# Patient Record
Sex: Female | Born: 1980 | Race: Black or African American | Hispanic: No | Marital: Married | Smoking: Never smoker
Health system: Southern US, Community
[De-identification: ages and names within clinical notes are randomized; demographics above are authoritative.]

## PROBLEM LIST (undated history)

## (undated) ENCOUNTER — Inpatient Hospital Stay (HOSPITAL_COMMUNITY): Payer: Self-pay

## (undated) DIAGNOSIS — J069 Acute upper respiratory infection, unspecified: Secondary | ICD-10-CM

## (undated) DIAGNOSIS — L309 Dermatitis, unspecified: Secondary | ICD-10-CM

## (undated) DIAGNOSIS — O26899 Other specified pregnancy related conditions, unspecified trimester: Secondary | ICD-10-CM

## (undated) DIAGNOSIS — I10 Essential (primary) hypertension: Secondary | ICD-10-CM

## (undated) DIAGNOSIS — Z8619 Personal history of other infectious and parasitic diseases: Secondary | ICD-10-CM

## (undated) DIAGNOSIS — O24419 Gestational diabetes mellitus in pregnancy, unspecified control: Secondary | ICD-10-CM

## (undated) DIAGNOSIS — R12 Heartburn: Secondary | ICD-10-CM

## (undated) DIAGNOSIS — B999 Unspecified infectious disease: Secondary | ICD-10-CM

## (undated) DIAGNOSIS — D219 Benign neoplasm of connective and other soft tissue, unspecified: Secondary | ICD-10-CM

## (undated) DIAGNOSIS — IMO0002 Reserved for concepts with insufficient information to code with codable children: Secondary | ICD-10-CM

## (undated) DIAGNOSIS — R51 Headache: Secondary | ICD-10-CM

## (undated) DIAGNOSIS — R011 Cardiac murmur, unspecified: Secondary | ICD-10-CM

## (undated) HISTORY — DX: Cardiac murmur, unspecified: R01.1

## (undated) HISTORY — DX: Dermatitis, unspecified: L30.9

## (undated) HISTORY — DX: Essential (primary) hypertension: I10

## (undated) HISTORY — DX: Reserved for concepts with insufficient information to code with codable children: IMO0002

## (undated) HISTORY — PX: NO PAST SURGERIES: SHX2092

## (undated) HISTORY — DX: Unspecified infectious disease: B99.9

## (undated) HISTORY — DX: Personal history of other infectious and parasitic diseases: Z86.19

## (undated) HISTORY — DX: Acute upper respiratory infection, unspecified: J06.9

## (undated) HISTORY — DX: Benign neoplasm of connective and other soft tissue, unspecified: D21.9

---

## 2007-02-08 DIAGNOSIS — R87619 Unspecified abnormal cytological findings in specimens from cervix uteri: Secondary | ICD-10-CM

## 2007-02-08 DIAGNOSIS — IMO0002 Reserved for concepts with insufficient information to code with codable children: Secondary | ICD-10-CM

## 2007-02-08 HISTORY — DX: Reserved for concepts with insufficient information to code with codable children: IMO0002

## 2007-02-08 HISTORY — DX: Unspecified abnormal cytological findings in specimens from cervix uteri: R87.619

## 2008-02-08 DIAGNOSIS — D219 Benign neoplasm of connective and other soft tissue, unspecified: Secondary | ICD-10-CM

## 2008-02-08 HISTORY — DX: Benign neoplasm of connective and other soft tissue, unspecified: D21.9

## 2009-02-07 HISTORY — PX: WISDOM TOOTH EXTRACTION: SHX21

## 2010-05-31 ENCOUNTER — Ambulatory Visit: Payer: PRIVATE HEALTH INSURANCE

## 2010-12-27 ENCOUNTER — Encounter: Payer: Self-pay | Admitting: Family Medicine

## 2011-05-16 ENCOUNTER — Encounter (INDEPENDENT_AMBULATORY_CARE_PROVIDER_SITE_OTHER): Payer: BC Managed Care – HMO | Admitting: Obstetrics and Gynecology

## 2011-05-16 DIAGNOSIS — Z01419 Encounter for gynecological examination (general) (routine) without abnormal findings: Secondary | ICD-10-CM

## 2011-06-06 ENCOUNTER — Encounter: Payer: BC Managed Care – HMO | Admitting: Obstetrics and Gynecology

## 2011-06-06 ENCOUNTER — Other Ambulatory Visit: Payer: BC Managed Care – HMO

## 2011-06-14 ENCOUNTER — Ambulatory Visit (INDEPENDENT_AMBULATORY_CARE_PROVIDER_SITE_OTHER): Payer: BC Managed Care – HMO | Admitting: Obstetrics and Gynecology

## 2011-06-14 DIAGNOSIS — Z331 Pregnant state, incidental: Secondary | ICD-10-CM

## 2011-06-14 LAB — POCT URINALYSIS DIPSTICK
Blood, UA: NEGATIVE
Glucose, UA: NEGATIVE
Ketones, UA: NEGATIVE
Spec Grav, UA: 1.015

## 2011-06-14 NOTE — Progress Notes (Signed)
Hx reviewed by VL. Viability U/S ordered.  Per Misty Stanley, pt declined appt until 07/01/11 to acomodate work schedule.

## 2011-06-15 LAB — PRENATAL PANEL VII
Antibody Screen: NEGATIVE
Basophils Absolute: 0 10*3/uL (ref 0.0–0.1)
Eosinophils Absolute: 0.1 10*3/uL (ref 0.0–0.7)
Eosinophils Relative: 1 % (ref 0–5)
Hepatitis B Surface Ag: NEGATIVE
Lymphocytes Relative: 35 % (ref 12–46)
Lymphs Abs: 2.5 10*3/uL (ref 0.7–4.0)
MCV: 81.9 fL (ref 78.0–100.0)
Neutrophils Relative %: 53 % (ref 43–77)
Platelets: 339 10*3/uL (ref 150–400)
RBC: 4.74 MIL/uL (ref 3.87–5.11)
RDW: 14.8 % (ref 11.5–15.5)
Rubella: 90.2 IU/mL — ABNORMAL HIGH
WBC: 7 10*3/uL (ref 4.0–10.5)

## 2011-06-16 LAB — CULTURE, OB URINE: Colony Count: 40000

## 2011-06-16 LAB — HEMOGLOBINOPATHY EVALUATION: Hgb A: 97.4 % (ref 96.8–97.8)

## 2011-07-01 ENCOUNTER — Encounter: Payer: Self-pay | Admitting: Obstetrics and Gynecology

## 2011-07-01 ENCOUNTER — Ambulatory Visit (INDEPENDENT_AMBULATORY_CARE_PROVIDER_SITE_OTHER): Payer: BC Managed Care – HMO | Admitting: Obstetrics and Gynecology

## 2011-07-01 ENCOUNTER — Ambulatory Visit (INDEPENDENT_AMBULATORY_CARE_PROVIDER_SITE_OTHER): Payer: BC Managed Care – HMO

## 2011-07-01 ENCOUNTER — Other Ambulatory Visit: Payer: Self-pay | Admitting: Obstetrics and Gynecology

## 2011-07-01 VITALS — BP 102/62 | Wt 225.0 lb

## 2011-07-01 DIAGNOSIS — O3680X0 Pregnancy with inconclusive fetal viability, not applicable or unspecified: Secondary | ICD-10-CM

## 2011-07-01 DIAGNOSIS — D259 Leiomyoma of uterus, unspecified: Secondary | ICD-10-CM

## 2011-07-01 DIAGNOSIS — Z331 Pregnant state, incidental: Secondary | ICD-10-CM

## 2011-07-01 DIAGNOSIS — O3410 Maternal care for benign tumor of corpus uteri, unspecified trimester: Secondary | ICD-10-CM

## 2011-07-01 LAB — US OB TRANSVAGINAL

## 2011-07-01 LAB — US OB COMP LESS 14 WKS

## 2011-07-01 NOTE — Progress Notes (Signed)
Pt. Stated some pain from fibroid. No other issues today .  Ultrasound: 9+4 weeks = 10 weeks    EDD:01/27/12  FHR:119  Large fibroid measuring 15.4 cm displacing uterus to the right. Impossible to clarify origin yet but could be LUS.  Reviewed findings with patient and husband, increased risk of cesarean section, pain, PTL NOB on 07/06/11

## 2011-07-02 ENCOUNTER — Encounter: Payer: Self-pay | Admitting: Obstetrics and Gynecology

## 2011-07-02 DIAGNOSIS — D259 Leiomyoma of uterus, unspecified: Secondary | ICD-10-CM | POA: Insufficient documentation

## 2011-07-02 DIAGNOSIS — O3410 Maternal care for benign tumor of corpus uteri, unspecified trimester: Secondary | ICD-10-CM | POA: Insufficient documentation

## 2011-07-06 ENCOUNTER — Ambulatory Visit (INDEPENDENT_AMBULATORY_CARE_PROVIDER_SITE_OTHER): Payer: BC Managed Care – HMO | Admitting: Obstetrics and Gynecology

## 2011-07-06 ENCOUNTER — Encounter: Payer: Self-pay | Admitting: Obstetrics and Gynecology

## 2011-07-06 ENCOUNTER — Ambulatory Visit (INDEPENDENT_AMBULATORY_CARE_PROVIDER_SITE_OTHER): Payer: BC Managed Care – HMO

## 2011-07-06 VITALS — BP 122/64 | Wt 229.0 lb

## 2011-07-06 DIAGNOSIS — O3410 Maternal care for benign tumor of corpus uteri, unspecified trimester: Secondary | ICD-10-CM

## 2011-07-06 DIAGNOSIS — Z3490 Encounter for supervision of normal pregnancy, unspecified, unspecified trimester: Secondary | ICD-10-CM

## 2011-07-06 DIAGNOSIS — D259 Leiomyoma of uterus, unspecified: Secondary | ICD-10-CM

## 2011-07-06 LAB — US OB COMP LESS 14 WKS

## 2011-07-06 LAB — POCT WET PREP (WET MOUNT)

## 2011-07-06 MED ORDER — METRONIDAZOLE 500 MG PO TABS: 500.0000 mg | ORAL_TABLET | Freq: Two times a day (BID) | ORAL | Status: AC

## 2011-07-06 NOTE — Progress Notes (Signed)
No complaints  Filed Vitals:   07/06/11 1509  BP: 122/64   Unable to hear FHTs  ROS: noncontributory  Physical Examination: General appearance - alert, well appearing, and in no distress Neck - supple, no significant adenopathy Chest - clear to auscultation, no wheezes, rales or rhonchi, symmetric air entry Heart - normal rate and regular rhythm Abdomen - soft, nontender, nondistended, no masses or organomegaly Breasts - breasts appear normal, no suspicious masses, no skin or nipple changes or axillary nodes Pelvic - normal external genitalia, vulva, vagina, cervix, uterus and adnexa Back exam - no CVAT Extremities - no edema, redness or tenderness in the calves or thighs  Results for orders placed in visit on 07/06/11  US OB COMP LESS 14 WKS      Component Value Range   See scanned in report             POCT WET PREP (WET MOUNT)      Component Value Range   Source Wet Prep POC vaginal     WBC, Wet Prep HPF POC       Bacteria Wet Prep HPF POC mod     BACTERIA WET PREP MORPHOLOGY POC       Clue Cells Wet Prep HPF POC Moderate     CLUE CELLS WET PREP WHIFF POC Positive Whiff     Yeast Wet Prep HPF POC None     KOH Wet Prep POC       Trichomonas Wet Prep HPF POC none     pH 5.0     A/P Declined genetic testing-will discuss with husband re: quad screen BV-flagyl RTO 4wks PN care reviewed U/S q4wks starting at 26wks secondary to large fibroid GC/CT with consent PNL need to be reviewed at [facility]

## 2011-07-07 LAB — GC/CHLAMYDIA PROBE AMP, GENITAL: GC Probe Amp, Genital: NEGATIVE

## 2011-07-19 DIAGNOSIS — D259 Leiomyoma of uterus, unspecified: Secondary | ICD-10-CM

## 2011-08-03 ENCOUNTER — Ambulatory Visit (INDEPENDENT_AMBULATORY_CARE_PROVIDER_SITE_OTHER): Payer: BC Managed Care – HMO | Admitting: Obstetrics and Gynecology

## 2011-08-03 VITALS — BP 120/80 | Wt 232.0 lb

## 2011-08-03 DIAGNOSIS — Z331 Pregnant state, incidental: Secondary | ICD-10-CM

## 2011-08-03 NOTE — Progress Notes (Signed)
Pt . Stated no issues today .Could not get heart tones on pt.

## 2011-08-03 NOTE — Progress Notes (Signed)
Large fibroid: uterus at 20 weeks size and unable to hear FHT Bedside sono: FHR 140 bpm

## 2011-08-08 ENCOUNTER — Telehealth: Payer: Self-pay | Admitting: Obstetrics and Gynecology

## 2011-08-08 NOTE — Telephone Encounter (Signed)
TC TO PT REGARDING MESSAGE. PT WANTED TO KNOW IS IT SAFE TO TAKE AMOXICILLIN WHILE PREG. PER VL, INFORMED PT THAT IT IS OKAY TO TAKE AMOXICILLIN WHILE PREG. PT VOICED UNDERSTANDING.

## 2011-08-15 ENCOUNTER — Encounter: Payer: Self-pay | Admitting: Obstetrics and Gynecology

## 2011-08-15 ENCOUNTER — Ambulatory Visit (INDEPENDENT_AMBULATORY_CARE_PROVIDER_SITE_OTHER): Payer: BC Managed Care – HMO | Admitting: Obstetrics and Gynecology

## 2011-08-15 ENCOUNTER — Telehealth: Payer: Self-pay | Admitting: Obstetrics and Gynecology

## 2011-08-15 VITALS — BP 140/80 | Wt 234.0 lb

## 2011-08-15 DIAGNOSIS — D259 Leiomyoma of uterus, unspecified: Secondary | ICD-10-CM

## 2011-08-15 DIAGNOSIS — O3410 Maternal care for benign tumor of corpus uteri, unspecified trimester: Secondary | ICD-10-CM

## 2011-08-15 DIAGNOSIS — R519 Headache, unspecified: Secondary | ICD-10-CM | POA: Insufficient documentation

## 2011-08-15 DIAGNOSIS — R51 Headache: Secondary | ICD-10-CM

## 2011-08-15 LAB — COMPREHENSIVE METABOLIC PANEL
Albumin: 3.5 g/dL (ref 3.5–5.2)
BUN: 7 mg/dL (ref 6–23)
CO2: 25 mEq/L (ref 19–32)
Glucose, Bld: 115 mg/dL — ABNORMAL HIGH (ref 70–99)
Potassium: 4.4 mEq/L (ref 3.5–5.3)
Sodium: 136 mEq/L (ref 135–145)
Total Bilirubin: 0.2 mg/dL — ABNORMAL LOW (ref 0.3–1.2)
Total Protein: 7.1 g/dL (ref 6.0–8.3)

## 2011-08-15 LAB — CBC
Hemoglobin: 12.8 g/dL (ref 12.0–15.0)
MCH: 26.7 pg (ref 26.0–34.0)
MCHC: 33.4 g/dL (ref 30.0–36.0)
Platelets: 319 10*3/uL (ref 150–400)

## 2011-08-15 LAB — LACTATE DEHYDROGENASE: LDH: 209 U/L (ref 94–250)

## 2011-08-15 LAB — URIC ACID: Uric Acid, Serum: 3.1 mg/dL (ref 2.4–7.0)

## 2011-08-15 MED ORDER — BUTALBITAL-APAP-CAFFEINE 50-325-40 MG PO TABS: 1.0000 | ORAL_TABLET | Freq: Four times a day (QID) | ORAL | Status: DC | PRN

## 2011-08-15 NOTE — Progress Notes (Signed)
Headache - Dental issue ? Related to headache ( Impacted Wisdom Tooth) Taking Amoxicillin antibiotic at present Baseline Labs for PIH/ Pre E (CBC, CMP, Uric Acid, 24hr urine) Fiorcet 1- 2 tabs po Q6 hrly PRN for headache as unrelieved with Tylenol Had been prescribed Tylenol #3 by Dentist but afraid to take same. Advised not to take tylenol #3 and Fioricet at the same time. States than she has not filled the Tylenol #3 prescription. To return in 4 weeks ROB but advised to return

## 2011-08-15 NOTE — Telephone Encounter (Signed)
Pt called, is 16 wks, states awakened @ 0500 today w/ HA, took Tylenol around 0600, went to lay down, head felt worse, sat up head felt worse, went to pharmacy to pick up Rx, took BP while there, readings were 153/88 and after 5 min was 144/90, pt denies blurred vision/swelling/epigastric pain, also denies any bldg/lof.  Pt worked in w/ DD today @ 0930 for eval.

## 2011-08-15 NOTE — Telephone Encounter (Signed)
keshia/ob

## 2011-08-15 NOTE — Progress Notes (Signed)
C/o severe HA's since this am no relief with Tylenol

## 2011-08-16 ENCOUNTER — Telehealth: Payer: Self-pay

## 2011-08-16 LAB — CREATININE, URINE, 24 HOUR: Creatinine, Urine: 115.5 mg/dL

## 2011-08-16 NOTE — Addendum Note (Signed)
Addended by: Tim Lair on: 08/16/2011 12:02 PM   Modules accepted: Orders

## 2011-08-16 NOTE — Addendum Note (Signed)
Addended by: Theador Hawthorne on: 08/16/2011 12:09 PM   Modules accepted: Orders

## 2011-08-16 NOTE — Addendum Note (Signed)
Addended by: Theador Hawthorne on: 08/16/2011 11:58 AM   Modules accepted: Orders

## 2011-08-16 NOTE — Telephone Encounter (Signed)
Pt came in to drop off 24 hr urine per Earl Gala.  Per Jamesetta So front receptionist pt was still c/o migraine.and may have needed to be evaluated. Pt had could not walk as fast because her headache was so painful. Pt B/P was 122/64. Pt stated she felt a lot better than yesterday. Pt confirmed that she has started Fioricet. Pt stated she only  came in to drop off urine and not to be seen and that she was "OK". I informed pt to give medication a few more days to help w/ headaches since she was feeling better and BP was normal. Pt will call the office back if headaches worsen.   [facility] CMA

## 2011-08-22 ENCOUNTER — Encounter: Payer: BC Managed Care – HMO | Admitting: Obstetrics and Gynecology

## 2011-08-29 ENCOUNTER — Telehealth: Payer: Self-pay | Admitting: Obstetrics and Gynecology

## 2011-08-29 ENCOUNTER — Encounter: Payer: Self-pay | Admitting: Obstetrics and Gynecology

## 2011-08-29 ENCOUNTER — Ambulatory Visit (INDEPENDENT_AMBULATORY_CARE_PROVIDER_SITE_OTHER): Payer: BC Managed Care – HMO

## 2011-08-29 ENCOUNTER — Ambulatory Visit (INDEPENDENT_AMBULATORY_CARE_PROVIDER_SITE_OTHER): Payer: BC Managed Care – HMO | Admitting: Obstetrics and Gynecology

## 2011-08-29 VITALS — BP 106/64 | Temp 99.6°F | Wt 229.0 lb

## 2011-08-29 DIAGNOSIS — R109 Unspecified abdominal pain: Secondary | ICD-10-CM

## 2011-08-29 DIAGNOSIS — R102 Pelvic and perineal pain: Secondary | ICD-10-CM

## 2011-08-29 DIAGNOSIS — O368390 Maternal care for abnormalities of the fetal heart rate or rhythm, unspecified trimester, not applicable or unspecified: Secondary | ICD-10-CM

## 2011-08-29 DIAGNOSIS — N949 Unspecified condition associated with female genital organs and menstrual cycle: Secondary | ICD-10-CM

## 2011-08-29 DIAGNOSIS — O26899 Other specified pregnancy related conditions, unspecified trimester: Secondary | ICD-10-CM

## 2011-08-29 LAB — POCT URINALYSIS DIPSTICK
Bilirubin, UA: NEGATIVE
Glucose, UA: NEGATIVE
Leukocytes, UA: NEGATIVE
pH, UA: 6

## 2011-08-29 LAB — US OB LIMITED

## 2011-08-29 NOTE — Telephone Encounter (Signed)
Triage/epic 

## 2011-08-29 NOTE — Progress Notes (Signed)
[redacted]w[redacted]d C/o general malaise. Lower abdominal pain. Patient [place] the beach [place] and is not sure if it is GI disturbance from something she had eaten. On examination, round ligament pain left lower quadrant. When the patient had the condition explained to her, she agreed that it matches her pain symptoms. Unable to ausculate FHT's with handheld Doppler due to Fibroid USS for FHT:169, Breech Presentation,Posterior Placenta, Fluid is normal. Fibroid = 15cms x11cms x 13cms noted

## 2011-08-29 NOTE — Telephone Encounter (Signed)
Pt called complaining of low grade temp/chills and minor abdnl pain at night temp is controlled with tylenol, fever since  Thursday no other symptoms.Quinn Axe

## 2011-08-29 NOTE — Progress Notes (Signed)
Pt c/o abd pain and fever since Thursday.

## 2011-08-29 NOTE — Telephone Encounter (Signed)
Consulted with Angelique Blonder, was advised to bring pt in at 4:00 due to temp and abdl pain. Pt  Accepted appointment.Quinn Axe

## 2011-08-31 ENCOUNTER — Encounter: Payer: Self-pay | Admitting: Obstetrics and Gynecology

## 2011-08-31 ENCOUNTER — Other Ambulatory Visit: Payer: Self-pay | Admitting: Obstetrics and Gynecology

## 2011-08-31 ENCOUNTER — Ambulatory Visit (INDEPENDENT_AMBULATORY_CARE_PROVIDER_SITE_OTHER): Payer: BC Managed Care – HMO | Admitting: Obstetrics and Gynecology

## 2011-08-31 ENCOUNTER — Telehealth: Payer: Self-pay | Admitting: Obstetrics and Gynecology

## 2011-08-31 ENCOUNTER — Ambulatory Visit (INDEPENDENT_AMBULATORY_CARE_PROVIDER_SITE_OTHER): Payer: BC Managed Care – HMO

## 2011-08-31 VITALS — BP 130/64 | Wt 227.0 lb

## 2011-08-31 DIAGNOSIS — D259 Leiomyoma of uterus, unspecified: Secondary | ICD-10-CM

## 2011-08-31 DIAGNOSIS — Z3400 Encounter for supervision of normal first pregnancy, unspecified trimester: Secondary | ICD-10-CM

## 2011-08-31 DIAGNOSIS — Z3689 Encounter for other specified antenatal screening: Secondary | ICD-10-CM

## 2011-08-31 DIAGNOSIS — O3410 Maternal care for benign tumor of corpus uteri, unspecified trimester: Secondary | ICD-10-CM

## 2011-08-31 DIAGNOSIS — Z1389 Encounter for screening for other disorder: Secondary | ICD-10-CM

## 2011-08-31 DIAGNOSIS — Z331 Pregnant state, incidental: Secondary | ICD-10-CM

## 2011-08-31 LAB — URINE CULTURE

## 2011-08-31 NOTE — Telephone Encounter (Signed)
Spoke with pt states was seen today states urine is orange and when she wipes its orange pt states drinking plenty of water advised pt will consult with AR assistant and have her call her back pt voice understanding. Annice Pih aware of pt phone call

## 2011-08-31 NOTE — Telephone Encounter (Signed)
Ar pt had appt today

## 2011-08-31 NOTE — Telephone Encounter (Signed)
Triage/pt had appt today

## 2011-08-31 NOTE — Progress Notes (Signed)
Ultrasound today consistent with dates cervix 3.9 cm normal fluid no anomalies noted female gender cervix is closed large left lower uterine segment fibroid measuring 12 x 11 x 11.4 cm no adnexal masses and ovaries not visualized posterior placenta no previa U/S reviewed AFP today Pt will cont to obs fibroid for now. RTO 2wks secondary to increased pressure with fibroid

## 2011-09-01 ENCOUNTER — Telehealth: Payer: Self-pay

## 2011-09-01 LAB — US OB TRANSVAGINAL

## 2011-09-01 LAB — US OB COMP + 14 WK

## 2011-09-01 NOTE — Telephone Encounter (Signed)
Late entry for yesterday. Pt called after being seen in the office, stating that she noticed some orange colored urine and when she wiped she noticed a little orange stain. She states she has no pain, no bleeding and good FM, I asked her to observe for a few hrs and cb @ 3 pm on my direct ext. To report. When she cb, she said nothing had changed. No worsening sx's. I told her to continue to observe and call if anything worsens or changes. Pt is agreeable. Melody Comas A

## 2011-09-02 LAB — ALPHA FETOPROTEIN, MATERNAL
AFP: 28.3 IU/mL
MoM for AFP: 0.83
Open Spina bifida: NEGATIVE
Osb Risk: 1:48900 {titer}

## 2011-09-12 ENCOUNTER — Telehealth: Payer: Self-pay

## 2011-09-12 NOTE — Telephone Encounter (Signed)
LM re: wnl AFP testing. Emily Moss A

## 2011-09-13 ENCOUNTER — Ambulatory Visit (INDEPENDENT_AMBULATORY_CARE_PROVIDER_SITE_OTHER): Payer: BC Managed Care – HMO | Admitting: Obstetrics and Gynecology

## 2011-09-13 ENCOUNTER — Encounter: Payer: Self-pay | Admitting: Obstetrics and Gynecology

## 2011-09-13 VITALS — BP 122/68 | Wt 232.0 lb

## 2011-09-13 DIAGNOSIS — O3410 Maternal care for benign tumor of corpus uteri, unspecified trimester: Secondary | ICD-10-CM

## 2011-09-13 DIAGNOSIS — D259 Leiomyoma of uterus, unspecified: Secondary | ICD-10-CM

## 2011-09-13 NOTE — Progress Notes (Signed)
No complaints No pain with fibroid U/S at [facility] for EFW secondary to S>D because of fibroid and rec q4wk u/s RTO 4wks

## 2011-10-11 ENCOUNTER — Ambulatory Visit (INDEPENDENT_AMBULATORY_CARE_PROVIDER_SITE_OTHER): Payer: BC Managed Care – HMO

## 2011-10-11 ENCOUNTER — Ambulatory Visit (INDEPENDENT_AMBULATORY_CARE_PROVIDER_SITE_OTHER): Payer: BC Managed Care – HMO | Admitting: Obstetrics and Gynecology

## 2011-10-11 ENCOUNTER — Encounter: Payer: Self-pay | Admitting: Obstetrics and Gynecology

## 2011-10-11 ENCOUNTER — Other Ambulatory Visit: Payer: Self-pay

## 2011-10-11 VITALS — BP 122/62 | Wt 240.0 lb

## 2011-10-11 DIAGNOSIS — O26849 Uterine size-date discrepancy, unspecified trimester: Secondary | ICD-10-CM

## 2011-10-11 DIAGNOSIS — Z331 Pregnant state, incidental: Secondary | ICD-10-CM

## 2011-10-11 NOTE — Progress Notes (Signed)
[redacted]w[redacted]d Ultrasound: AGA at 63%, AFI normal, Cervix=3.27 cm          Large midline anterior fibroid: 13.2 x 10.8 x 11.2 cm with no significant change Follow-up ultrasound in 4 weeks. Glucola at next visit

## 2011-10-11 NOTE — Progress Notes (Signed)
[redacted]w[redacted]d Ultrasound shows:  SIUP  S>D     [place] EDD: 01/30/2012           EFW: 1 lb 10 oz           AFI: n/a           Cervical length: 3.27 cm           Placenta localization: posterior           Fetal presentation: transverse head maternal right.                    Anatomy survey is normal           Gender : female Comments: transverse presentation. Head maternal right. Posterior placenta. No previa. Normal fluid. AP pocket = 6.5cm. Normal linear growth: 55th%tile Note: Large, anterior fibroid is again seen.  Sits midline above cervix. Measures: 13.2cm x 10.8cm x 11.2cm Cx closed. Measured trans labially.

## 2011-10-12 LAB — US OB FOLLOW UP

## 2011-10-25 ENCOUNTER — Encounter: Payer: Self-pay | Admitting: Obstetrics and Gynecology

## 2011-10-25 ENCOUNTER — Ambulatory Visit (INDEPENDENT_AMBULATORY_CARE_PROVIDER_SITE_OTHER): Payer: BC Managed Care – PPO | Admitting: Obstetrics and Gynecology

## 2011-10-25 VITALS — BP 128/64 | Wt 245.0 lb

## 2011-10-25 DIAGNOSIS — Z6791 Unspecified blood type, Rh negative: Secondary | ICD-10-CM | POA: Insufficient documentation

## 2011-10-25 DIAGNOSIS — D259 Leiomyoma of uterus, unspecified: Secondary | ICD-10-CM

## 2011-10-25 DIAGNOSIS — O360990 Maternal care for other rhesus isoimmunization, unspecified trimester, not applicable or unspecified: Secondary | ICD-10-CM

## 2011-10-25 DIAGNOSIS — Z331 Pregnant state, incidental: Secondary | ICD-10-CM

## 2011-10-25 DIAGNOSIS — O26899 Other specified pregnancy related conditions, unspecified trimester: Secondary | ICD-10-CM

## 2011-10-25 LAB — HEMOGLOBIN: Hemoglobin: 11 g/dL — ABNORMAL LOW (ref 12.0–15.0)

## 2011-10-25 NOTE — Progress Notes (Signed)
[redacted]w[redacted]d Glucola given  

## 2011-10-25 NOTE — Progress Notes (Signed)
Doing well--some discomfort during sleep, with difficulty getting comfortable. Fibroid pain minimal. Plan [place] NV for growth, fluid, and fibroid assessment, with [place] q 4 weeks due to large fibroid. Glucola today, with Hgb and RPR Needs Rhophylac after NV.

## 2011-10-26 LAB — GLUCOSE TOLERANCE, 1 HOUR (50G) W/O FASTING: Glucose, 1 Hour GTT: 183 mg/dL — ABNORMAL HIGH (ref 70–140)

## 2011-10-27 ENCOUNTER — Other Ambulatory Visit: Payer: Self-pay | Admitting: Obstetrics and Gynecology

## 2011-10-27 ENCOUNTER — Telehealth: Payer: Self-pay | Admitting: Obstetrics and Gynecology

## 2011-10-27 DIAGNOSIS — O99810 Abnormal glucose complicating pregnancy: Secondary | ICD-10-CM

## 2011-10-27 NOTE — Telephone Encounter (Signed)
Notified pt of elevated 1 hr glucola.  Sch pt for 3 hr GTT on 11-03-2011 @ 8:00.  Mailed pt diet and appt info.

## 2011-11-04 ENCOUNTER — Telehealth: Payer: Self-pay

## 2011-11-04 DIAGNOSIS — O24419 Gestational diabetes mellitus in pregnancy, unspecified control: Secondary | ICD-10-CM

## 2011-11-04 LAB — GLUCOSE TOLERANCE, 3 HOURS
Glucose Tolerance, 1 hour: 204 mg/dL — ABNORMAL HIGH (ref 70–189)
Glucose Tolerance, 2 hour: 186 mg/dL — ABNORMAL HIGH (ref 70–164)
Glucose Tolerance, Fasting: 131 mg/dL — ABNORMAL HIGH (ref 70–104)
Glucose, GTT - 3 Hour: 190 mg/dL — ABNORMAL HIGH (ref 70–144)

## 2011-11-04 NOTE — Telephone Encounter (Signed)
Pt informed of abn 3 hour GTT. Referral sent e-pres to [facility]. Their office will call pt with appt. Pt voices understanding.

## 2011-11-04 NOTE — Telephone Encounter (Signed)
Message copied by Raylene Everts on Fri Nov 04, 2011 11:18 AM ------      Message from: Cornelius Moras      Created: Fri Nov 04, 2011  7:09 AM       Refer to [facility] for diet, insulin teaching.      Plan FBS and 2 hour pcs.

## 2011-11-06 DIAGNOSIS — O24419 Gestational diabetes mellitus in pregnancy, unspecified control: Secondary | ICD-10-CM | POA: Insufficient documentation

## 2011-11-08 ENCOUNTER — Encounter: Payer: Self-pay | Admitting: Obstetrics and Gynecology

## 2011-11-08 ENCOUNTER — Ambulatory Visit (INDEPENDENT_AMBULATORY_CARE_PROVIDER_SITE_OTHER): Payer: BC Managed Care – PPO

## 2011-11-08 ENCOUNTER — Ambulatory Visit (INDEPENDENT_AMBULATORY_CARE_PROVIDER_SITE_OTHER): Payer: BC Managed Care – PPO | Admitting: Obstetrics and Gynecology

## 2011-11-08 VITALS — BP 102/72 | Wt 238.0 lb

## 2011-11-08 DIAGNOSIS — Z331 Pregnant state, incidental: Secondary | ICD-10-CM

## 2011-11-08 DIAGNOSIS — O99810 Abnormal glucose complicating pregnancy: Secondary | ICD-10-CM

## 2011-11-08 DIAGNOSIS — O24419 Gestational diabetes mellitus in pregnancy, unspecified control: Secondary | ICD-10-CM

## 2011-11-08 DIAGNOSIS — D259 Leiomyoma of uterus, unspecified: Secondary | ICD-10-CM

## 2011-11-08 LAB — US OB FOLLOW UP

## 2011-11-08 NOTE — Progress Notes (Signed)
U/s today for s>d and large ant. Fibroid Transverse presentation, head maternal right Posterior-fundal placenta Normal fluid Normal linear growth Fibroid measures 13.6 cm x 10 cm x 11 cm

## 2011-11-08 NOTE — Progress Notes (Signed)
Patient ID: Emily Moss, female   DOB: 1980/05/10, 31 y.o.   MRN: [id number] [redacted]w[redacted]d Has appt regarding diabetic education, 7# wt loss changed diet with diagnosis. Reviewed s/s preterm labor, srom, vag bleeding, kick counts to report, enc 8 water daily and frequent voids. Lavera Guise, CNM

## 2011-11-08 NOTE — Addendum Note (Signed)
Addended by: Loralyn Freshwater on: 11/08/2011 12:07 PM   Modules accepted: Orders

## 2011-11-09 ENCOUNTER — Inpatient Hospital Stay (HOSPITAL_COMMUNITY)
Admission: AD | Admit: 2011-11-09 | Discharge: 2011-11-09 | Disposition: A | Discharge status: Home / Self Care | Payer: BC Managed Care – PPO | Source: Ambulatory Visit | Attending: Obstetrics and Gynecology | Admitting: Obstetrics and Gynecology

## 2011-11-09 ENCOUNTER — Telehealth: Payer: Self-pay

## 2011-11-09 ENCOUNTER — Encounter: Payer: BC Managed Care – PPO | Attending: Obstetrics and Gynecology | Admitting: *Deleted

## 2011-11-09 ENCOUNTER — Encounter: Payer: Self-pay | Admitting: *Deleted

## 2011-11-09 VITALS — Ht 65.0 in | Wt 238.4 lb

## 2011-11-09 DIAGNOSIS — O24419 Gestational diabetes mellitus in pregnancy, unspecified control: Secondary | ICD-10-CM

## 2011-11-09 DIAGNOSIS — Z2989 Encounter for other specified prophylactic measures: Secondary | ICD-10-CM | POA: Insufficient documentation

## 2011-11-09 DIAGNOSIS — O99810 Abnormal glucose complicating pregnancy: Secondary | ICD-10-CM | POA: Insufficient documentation

## 2011-11-09 DIAGNOSIS — Z713 Dietary counseling and surveillance: Secondary | ICD-10-CM | POA: Insufficient documentation

## 2011-11-09 DIAGNOSIS — Z298 Encounter for other specified prophylactic measures: Secondary | ICD-10-CM | POA: Insufficient documentation

## 2011-11-09 LAB — ABO/RH: ABO/RH(D): O NEG

## 2011-11-09 MED ORDER — RHO D IMMUNE GLOBULIN 1500 UNIT/2ML IJ SOLN
300.0000 ug | Freq: Once | INTRAMUSCULAR | Status: AC
  Administered 2011-11-09: 300 ug via INTRAMUSCULAR

## 2011-11-09 MED ORDER — BAYER MICROLET LANCETS MISC: Status: DC

## 2011-11-09 NOTE — Telephone Encounter (Signed)
Pt c/b regarding lancets. Pt states lancets are [employer]. Sent test strip & lancet order to [facility] [address]. Pt agrees and understands.

## 2011-11-09 NOTE — Patient Instructions (Signed)
Goals:  Check glucose levels per MD as instructed  Follow Gestational Diabetes Diet as instructed  Call for follow-up as needed    

## 2011-11-09 NOTE — MAU Note (Signed)
Has never received rhophyllac previously.  Handouts given.  NKDA. No complaints.  Time associated with blood draw and injection discussed.

## 2011-11-09 NOTE — Progress Notes (Signed)
  Patient was seen on 11/09/2011 for Gestational Diabetes self-management class at the [facility]. The following learning objectives were met by the patient during this course:   States the definition of Gestational Diabetes  States why dietary management is important in controlling blood glucose  Describes the effects each nutrient has on blood glucose levels  Demonstrates ability to create a balanced meal plan  Demonstrates carbohydrate counting   States when to check blood glucose levels  Demonstrates proper blood glucose monitoring techniques  States the effect of stress and exercise on blood glucose levels  States the importance of limiting caffeine and abstaining from alcohol and smoking  Blood glucose monitor given: Blood glucose monitor given: [employer]er EZ Meter Kit Lot # [id number] Exp: 05/2013 Blood glucose reading: 81 mg/dl  Patient instructed to monitor glucose levels: FBS: 60 - <90 2 hour: <120  *Patient received handouts:  Nutrition Diabetes and Pregnancy  Carbohydrate Counting List  Patient will be seen for follow-up as needed.

## 2011-11-09 NOTE — Telephone Encounter (Signed)
TC from pt requesting lancets and test strips for her Bayer Contour Next Monitor. Pt didn't know which lancets she uses at the time. Informed pt to c/b with that information so we can inform the pharmacy. Pt agrees and understands.

## 2011-11-10 ENCOUNTER — Other Ambulatory Visit: Payer: Self-pay | Admitting: Obstetrics and Gynecology

## 2011-11-10 ENCOUNTER — Telehealth: Payer: Self-pay | Admitting: Obstetrics and Gynecology

## 2011-11-10 LAB — RH IG WORKUP (INCLUDES ABO/RH)
ABO/RH(D): O NEG
Antibody Screen: NEGATIVE
Fetal Screen: NEGATIVE
Gestational Age(Wks): 28

## 2011-11-10 NOTE — Telephone Encounter (Signed)
TC from pt . States did not receive test strips.  RX for [employer] Next EZ test strips use 4 x a day as directed # 100 with RF through 01/2012 called to [facility] at [address]. Pt notified.

## 2011-11-11 ENCOUNTER — Telehealth: Payer: Self-pay | Admitting: Obstetrics and Gynecology

## 2011-11-11 NOTE — Telephone Encounter (Signed)
VM from pt about test strips.

## 2011-11-11 NOTE — Telephone Encounter (Signed)
VM from pharmacy. Needs prior authorization for Bayer Contour Next test strips.  Contact [phone]  [facility] [phone]

## 2011-11-11 NOTE — Telephone Encounter (Signed)
Spoke with pt informing her we contacted prior auth "Morrie Sheldon" and they need to further review her claim before they are able to cover her test strips. They will send [place] a fax within 24-48 hrs letting us know whether or not they will be able to cover her test strips. Pt is aware.

## 2011-11-11 NOTE — Telephone Encounter (Signed)
TC from pt regarding preauth for test strips.

## 2011-11-14 NOTE — Telephone Encounter (Signed)
Spoke with pt informing her the insurance won't pay for her test strips. [employer] prior auth dept Morrie Sheldon) informed me Accucheks & One Touchs are covered. Informed pt we will consult with provider & let her know something ASAP.

## 2011-11-15 NOTE — Telephone Encounter (Signed)
Fine. Thanks! VL

## 2011-11-16 ENCOUNTER — Telehealth: Payer: Self-pay

## 2011-11-16 NOTE — Telephone Encounter (Signed)
Informed pt spoke called with pharmacist "Reita Cliche" called in 100 One Touch Ultra Mini Strips & 100 Lancets for her with 1 yr rf. Pt is thankful & agrees.

## 2011-11-22 ENCOUNTER — Ambulatory Visit (INDEPENDENT_AMBULATORY_CARE_PROVIDER_SITE_OTHER): Payer: BC Managed Care – PPO | Admitting: Obstetrics and Gynecology

## 2011-11-22 ENCOUNTER — Encounter: Payer: Self-pay | Admitting: Obstetrics and Gynecology

## 2011-11-22 VITALS — BP 126/64 | Wt 237.0 lb

## 2011-11-22 DIAGNOSIS — O99810 Abnormal glucose complicating pregnancy: Secondary | ICD-10-CM

## 2011-11-22 DIAGNOSIS — O24419 Gestational diabetes mellitus in pregnancy, unspecified control: Secondary | ICD-10-CM

## 2011-11-22 NOTE — Progress Notes (Signed)
[redacted]w[redacted]d No complaints.

## 2011-11-22 NOTE — Progress Notes (Signed)
[redacted]w[redacted]d Fasting blood sugars 92 and 99.  2 hour PCs less than 107 except for one. Doing well. Return office in 2 weeks. Dr. Stefano Gaul

## 2011-12-06 ENCOUNTER — Ambulatory Visit (INDEPENDENT_AMBULATORY_CARE_PROVIDER_SITE_OTHER): Payer: BC Managed Care – PPO | Admitting: Obstetrics and Gynecology

## 2011-12-06 VITALS — BP 122/78 | Wt 236.0 lb

## 2011-12-06 DIAGNOSIS — O99810 Abnormal glucose complicating pregnancy: Secondary | ICD-10-CM

## 2011-12-06 DIAGNOSIS — O24419 Gestational diabetes mellitus in pregnancy, unspecified control: Secondary | ICD-10-CM

## 2011-12-06 NOTE — Progress Notes (Signed)
[redacted]w[redacted]d  Pt has no concerns today.  Pt brought  blood sugars.

## 2011-12-06 NOTE — Progress Notes (Signed)
[redacted]w[redacted]d All blood sugars are normal. Pregnancy discussed. Return to office in 1 week. Back showing a

## 2011-12-13 ENCOUNTER — Ambulatory Visit (INDEPENDENT_AMBULATORY_CARE_PROVIDER_SITE_OTHER): Payer: BC Managed Care – PPO | Admitting: Obstetrics and Gynecology

## 2011-12-13 ENCOUNTER — Encounter: Payer: Self-pay | Admitting: Obstetrics and Gynecology

## 2011-12-13 VITALS — BP 126/78 | Wt 238.0 lb

## 2011-12-13 DIAGNOSIS — O99810 Abnormal glucose complicating pregnancy: Secondary | ICD-10-CM

## 2011-12-13 DIAGNOSIS — O24419 Gestational diabetes mellitus in pregnancy, unspecified control: Secondary | ICD-10-CM

## 2011-12-13 NOTE — Addendum Note (Signed)
Addended by: Marla Roe A on: 12/13/2011 11:40 AM   Modules accepted: Orders

## 2011-12-13 NOTE — Progress Notes (Signed)
[redacted]w[redacted]d CBGs are good on diet Pt doing 2x day checks U/s at [facility] for EFW secondary to GDM on diet RTO 1wk

## 2011-12-20 ENCOUNTER — Encounter: Payer: Self-pay | Admitting: Obstetrics and Gynecology

## 2011-12-20 ENCOUNTER — Ambulatory Visit (INDEPENDENT_AMBULATORY_CARE_PROVIDER_SITE_OTHER): Payer: BC Managed Care – PPO | Admitting: Obstetrics and Gynecology

## 2011-12-20 ENCOUNTER — Ambulatory Visit (INDEPENDENT_AMBULATORY_CARE_PROVIDER_SITE_OTHER): Payer: BC Managed Care – PPO

## 2011-12-20 VITALS — BP 116/74 | Wt 240.0 lb

## 2011-12-20 DIAGNOSIS — O24419 Gestational diabetes mellitus in pregnancy, unspecified control: Secondary | ICD-10-CM

## 2011-12-20 DIAGNOSIS — O99810 Abnormal glucose complicating pregnancy: Secondary | ICD-10-CM

## 2011-12-20 LAB — US OB FOLLOW UP

## 2011-12-20 NOTE — Progress Notes (Signed)
[redacted]w[redacted]d bS are normal except one EFW 5-12 73% AFI 17.8 cm tvs presentation 12.5 cm anterior fibroid ECV vs C/S discussed.  ECV may not be successful bc of fibroid Pt may need classical c/s b/c of fibroid.  She understands if this were to happen she would always need a cesearean.  She also understands she is at risk for blood transfusion Repeat US at 36 weeks for growth and position

## 2011-12-20 NOTE — Patient Instructions (Signed)
External Cephalic Version  External cephalic version is turning a baby that is presenting their buttocks first (breech) or is lying sideways in the uterus (transverse) to a head-first position. This makes the labor and delivery faster, safer for the mother and baby, and lessens the chance for a Cesarean section. It should not be tried until the pregnancy is [redacted] weeks along or longer.  BEFORE THE PROCEDURE   · Do not take aspirin.  · Do not eat for 4 hours before the procedure.  · Tell your caregiver if you have a cold, fever or an infection.  · Tell your caregiver if you are having contractions.  · Tell your caregiver if you are leaking or had a gush of fluid from your vagina.  · Tell your caregiver if you have any vaginal bleeding or abnormal discharge.  · If you are being admitted the same day, arrive at the hospital at least one hour before the procedure to sign any necessary documents and to get prepared for the procedure.  · Tell your caregiver if you had any problems with anesthetics in the past.  · Tell your caregiver if you are taking any medications that your caregiver does not know about. This includes over-the-counter and prescription drugs, herbs, eye drops and creams.  PROCEDURE  · First, an ultrasound is done to make sure the baby is breech or transverse.  · A non-stress test or biophysical profile is done on the baby before the ECV. This is done to make sure it is safe for the baby to have the ECV. It may also be done after the procedure to make sure the baby is OK.  · ECV is done in the delivery/surgical room with an anesthesiologist present. There should be a setup for an emergency Cesarean section with a full nursing and nursery staff available and ready.  · The patient may be given a medication to relax the uterine muscles. An epidural may be given for any discomfort. It is helpful for the success of the ECV.  · An electronic fetal monitor is placed on the uterus during the procedure to make sure  the baby is OK.  · If the mother is Rh negative, Rho-gam will be given to her to prevent Rh problems for future pregnancies.  · The mother is followed closely for 2 to 3 hours after the procedure to make sure no problems develop.  BENEFITS OF ECV  · Easier and safer labor and delivery for the mother and baby.  · Lower incidence of Cesarean section.  · Lower costs with a vaginal delivery.  RISKS OF ECV  · The placenta pulls away from the wall of the uterus before delivery (abruption of the placenta).  · Rupture of the uterus, especially in patients with a previous Cesarean section.  · Fetal distress.  · Early (premature) labor.  · Premature rupture of the membranes.  · The baby will return to the breech or transverse lie position.  · Death of the fetus can happen, but is very rare.  ECV SHOULD BE STOPPED IF:  · The fetal heart tones drop.  · The mother is having a lot of pain.  · You cannot turn the baby after several attempts.  ECV SHOULD NOT BE DONE IF:  · The non-stress test or biophysical profile is abnormal.  · There is vaginal bleeding.  · An abnormal shaped uterus is present.  · There is heart disease or uncontrolled high blood pressure in the mother.  ·   There are twins or more.  · The placenta covers the opening of the cervix (placenta previa).  · You had a previous cesarean section with a classical incision or major surgery of the uterus.  · There is not enough amniotic fluid in the sac (oligohydramnios).  · The baby is too small for the pregnancy or has not developed normally (anomaly).  · Your membranes have ruptured.  HOME CARE INSTRUCTIONS   · Have someone take you home after the procedure.  · Rest at home for several hours.  · Have someone stay with you for a few hours after you get home.  · After ECV, continue with your prenatal visits as directed.  · Continue your regular diet, rest and activities.  · Do not do any strenuous activities for a couple of days.  SEEK IMMEDIATE MEDICAL CARE IF:   · You  develop vaginal bleeding.  · You have fluid coming out of your vagina (bag of water may have broken).  · You develop uterine contractions.  · You do not feel the baby move or there is less movement of the baby.  · You develop abdominal pain.  · You develop an oral temperature of 102° F (38.9° C) or higher.  Document Released: 07/19/2006 Document Revised: 04/18/2011 Document Reviewed: 05/14/2008  [employer]® Patient Information ©2013 [employer].

## 2011-12-20 NOTE — Progress Notes (Signed)
Pt declines flu shot at this time.

## 2011-12-29 ENCOUNTER — Ambulatory Visit (INDEPENDENT_AMBULATORY_CARE_PROVIDER_SITE_OTHER): Payer: BC Managed Care – PPO | Admitting: Obstetrics and Gynecology

## 2011-12-29 VITALS — BP 112/62 | Wt 242.0 lb

## 2011-12-29 DIAGNOSIS — Z3490 Encounter for supervision of normal pregnancy, unspecified, unspecified trimester: Secondary | ICD-10-CM

## 2011-12-29 DIAGNOSIS — Z331 Pregnant state, incidental: Secondary | ICD-10-CM

## 2011-12-29 DIAGNOSIS — O24419 Gestational diabetes mellitus in pregnancy, unspecified control: Secondary | ICD-10-CM

## 2011-12-29 DIAGNOSIS — O99810 Abnormal glucose complicating pregnancy: Secondary | ICD-10-CM

## 2011-12-29 DIAGNOSIS — D259 Leiomyoma of uterus, unspecified: Secondary | ICD-10-CM

## 2011-12-29 NOTE — Progress Notes (Signed)
[redacted]w[redacted]d No complaints today. CBG's on chart.

## 2011-12-29 NOTE — Addendum Note (Signed)
Addended by: Mathis Bud on: 12/29/2011 04:20 PM   Modules accepted: Orders

## 2011-12-29 NOTE — Progress Notes (Signed)
[redacted]w[redacted]d Blood sugars within normal limits. Beta strep today. Return office in 1 week. Ultrasound next visit for fetal position (previous transverse presentation). Dr. Stefano Gaul

## 2012-01-02 ENCOUNTER — Ambulatory Visit (INDEPENDENT_AMBULATORY_CARE_PROVIDER_SITE_OTHER): Payer: BC Managed Care – PPO | Admitting: Obstetrics and Gynecology

## 2012-01-02 ENCOUNTER — Ambulatory Visit (INDEPENDENT_AMBULATORY_CARE_PROVIDER_SITE_OTHER): Payer: BC Managed Care – PPO

## 2012-01-02 ENCOUNTER — Encounter: Payer: Self-pay | Admitting: Obstetrics and Gynecology

## 2012-01-02 VITALS — BP 110/64 | Wt 242.0 lb

## 2012-01-02 DIAGNOSIS — O24419 Gestational diabetes mellitus in pregnancy, unspecified control: Secondary | ICD-10-CM

## 2012-01-02 DIAGNOSIS — Z331 Pregnant state, incidental: Secondary | ICD-10-CM

## 2012-01-02 DIAGNOSIS — D259 Leiomyoma of uterus, unspecified: Secondary | ICD-10-CM

## 2012-01-02 DIAGNOSIS — O99810 Abnormal glucose complicating pregnancy: Secondary | ICD-10-CM

## 2012-01-02 NOTE — Patient Instructions (Signed)
Cesarean Delivery  Cesarean delivery is the birth of a baby through a cut (incision) in the abdomen and womb (uterus).  LET YOUR CAREGIVER KNOW ABOUT:  Complicationsinvolving the pregnancy.  Allergies.  Medicines taken including herbs, eyedrops, over-the-counter medicines, and creams.  Use of steroids (by mouth or creams).  Previous problems with anesthetics or numbing medicine.  Previous surgery.  History of blood clots.  History of bleeding or blood problems.  Other health problems. RISKS AND COMPLICATIONS   Bleeding.  Infection.  Blood clots.  Injury to surrounding organs.  Anesthesia problems.  Injury to the baby. BEFORE THE PROCEDURE   A tube (Foley catheter) will be placed in your bladder. The Foley catheter drains the urine from your bladder into a bag. This keeps your bladder empty during surgery.  An intravenous access tube (IV) will be placed in your arm.  Hair may be removed from your pubic area and your lower abdomen. This is to prevent infection in the incision site.  You may be given an antacid medicine to drink. This will prevent acid contents in your stomach from going into your lungs if you vomit during the surgery.  You may be given an antibiotic medicine to prevent infection. PROCEDURE   You may be given medicine to numb the lower half of your body (regional anesthetic). If you were in labor, you may have already had an epidural in place which can be used in both labor and cesarean delivery. You may possibly be given medicine to make you sleep (general anesthetic) though this is not as common.  An incision will be made in your abdomen that extends to your uterus. There are 2 basic kinds of incisions:  The horizontal (transverse) incision. Horizontal incisions are used for most routine cesarean deliveries.  The vertical (up and down) incision. This is less commonly used. This is most often reserved for women who have a serious complication  (extreme prematurity) or under emergency situations.  The horizontal and vertical incisions may both be used at the same time. However, this is very uncommon.  Your baby will then be delivered. AFTER THE PROCEDURE   If you were awake during the surgery, you will see your baby right away. If you were asleep, you will see your baby as soon as you are awake.  You may breastfeed your baby after surgery.  You may be able to get up and walk the same day as the surgery. If you need to stay in bed for a period of time, you will receive help to turn, cough, and take deep breaths after surgery. This helps prevent lung problems such as pneumonia.  Do not get out of bed alone the first time after surgery. You will need help getting out of bed until you are able to do this by yourself.  You may be able to shower the day after your cesarean delivery. After the bandage (dressing) is taken off the incision site, a nurse will assist you to shower, if you like.  You will have pneumatic compressing hose placed on your feet or lower legs. These hose are used to prevent blood clots. When you are up and walking regularly, they will no longer be necessary.  Do not cross your legs when you sit.  Save any blood clots that you pass. If you pass a clot while on the toilet, do not flush it. Call for the nurse. Tell the nurse if you think you are bleeding too much or passing too many   clots.  Start drinking liquids and eating food as directed by your caregiver. If your stomach is not ready, drinking and eating too soon can cause an increase in bloating and swelling of your intestine and abdomen. This is very uncomfortable.  You will be given medicine as needed. Let your caregivers know if you are hurting. They want you to be comfortable. You may also be given an antibiotic to prevent an infection.  Your IV will be taken out when you are drinking a reasonable amount of fluids. The Foley catheter is taken out when  you are up and walking.  If your blood type is Rh negative and your baby's blood type is Rh positive, you will be given a shot of anti-D immune globulin. This shot prevents you from having Rh problems with a future pregnancy. You should get the shot even if you had your tubes tied (tubal ligation).  If you are allowed to take the baby for a walk, place the baby in the bassinet and push it. Do not carry your baby in your arms. Document Released: 01/24/2005 Document Revised: 04/18/2011 Document Reviewed: 05/21/2010 [employer] Patient Information 2013 [employer].  

## 2012-01-02 NOTE — Progress Notes (Signed)
A/P GBS negative Fetal kick counts reviewed Labor reviewed with pt All patients  questions answered Pt did not bring blood sugars.  She reports they are all normal [place] AFI 19.12 cm Infant tranverse Placenta is posterior and fundal 10 cm anterior fibroid Pt given the option of ECV vs C/S Pt chose c/s R&B reviewed She understands she is at risk for blood transfusion, hysterectomy and classical cesarean SECTION

## 2012-01-02 NOTE — Progress Notes (Signed)
Pt declines flu shot at this time.

## 2012-01-09 ENCOUNTER — Encounter (HOSPITAL_COMMUNITY): Payer: Self-pay | Admitting: Pharmacist

## 2012-01-09 ENCOUNTER — Telehealth: Payer: Self-pay | Admitting: Obstetrics and Gynecology

## 2012-01-09 LAB — US OB FOLLOW UP

## 2012-01-09 NOTE — Telephone Encounter (Signed)
Cesarean section scheduled for 01/20/12 @ 1:30 with VH/ND. Patient instructed to arrive at 11:30am -Adrianne Pridgen

## 2012-01-10 ENCOUNTER — Ambulatory Visit (INDEPENDENT_AMBULATORY_CARE_PROVIDER_SITE_OTHER): Payer: BC Managed Care – PPO | Admitting: Obstetrics and Gynecology

## 2012-01-10 ENCOUNTER — Ambulatory Visit (INDEPENDENT_AMBULATORY_CARE_PROVIDER_SITE_OTHER): Payer: BC Managed Care – PPO

## 2012-01-10 ENCOUNTER — Encounter: Payer: BC Managed Care – PPO | Admitting: Obstetrics and Gynecology

## 2012-01-10 VITALS — BP 110/62 | Wt 243.0 lb

## 2012-01-10 DIAGNOSIS — D259 Leiomyoma of uterus, unspecified: Secondary | ICD-10-CM

## 2012-01-10 DIAGNOSIS — O24419 Gestational diabetes mellitus in pregnancy, unspecified control: Secondary | ICD-10-CM

## 2012-01-10 DIAGNOSIS — Z331 Pregnant state, incidental: Secondary | ICD-10-CM

## 2012-01-10 DIAGNOSIS — O99810 Abnormal glucose complicating pregnancy: Secondary | ICD-10-CM

## 2012-01-10 DIAGNOSIS — O3410 Maternal care for benign tumor of corpus uteri, unspecified trimester: Secondary | ICD-10-CM

## 2012-01-10 DIAGNOSIS — Z1389 Encounter for screening for other disorder: Secondary | ICD-10-CM

## 2012-01-10 NOTE — Progress Notes (Signed)
Pt stated no issues today.  GDM with nl cbg's Ultrasound shows:  SIUP  S=D     [place] EDD:01/27/12            AFI: 21.34                                 EFW:[id number]            Placenta localization: posterior           Fetal presentation: vertex  Comments: AFI is Normal(85th%) Anterior Fibroid noted = 10.5x 10.2cm,10.7          Pt scheduled for primary c/s for abnormal lie with large anterior fibroid.  Fetus now vertex. Cx not favorable and vertex not engaged in pelvis Options: Primary c/s as planned at 39 wks or with the onset of labor   Primary c/s at 40 wks if no spontaneous labor by then   Labor if labor occurs spontaneously prior to 40 wks   Induction at 40 wks if undelivered Risks and benefits reviewed for each option.  Pt uncertain what she wants to do yet.  She understands she may need myomectomy postpartum no matter her route of delivery and that may make subsequent deliveries by C/S. Will keep c/s on the books for now.

## 2012-01-11 ENCOUNTER — Telehealth: Payer: Self-pay | Admitting: Obstetrics and Gynecology

## 2012-01-11 ENCOUNTER — Other Ambulatory Visit: Payer: Self-pay | Admitting: Obstetrics and Gynecology

## 2012-01-11 NOTE — Telephone Encounter (Signed)
Cesarean section rescheduled to 01/20/12 @ 3:30 with VH/AVS. Patient instructed to arrive at 1:30pm. -Adrianne Pridgen

## 2012-01-13 ENCOUNTER — Inpatient Hospital Stay (HOSPITAL_COMMUNITY): Admission: RE | Admit: 2012-01-13 | Payer: BC Managed Care – PPO | Source: Ambulatory Visit

## 2012-01-13 ENCOUNTER — Telehealth: Payer: Self-pay

## 2012-01-13 NOTE — Telephone Encounter (Signed)
Order for breast pump faxed to [phone]

## 2012-01-16 ENCOUNTER — Encounter: Payer: Self-pay | Admitting: Obstetrics and Gynecology

## 2012-01-16 ENCOUNTER — Ambulatory Visit (INDEPENDENT_AMBULATORY_CARE_PROVIDER_SITE_OTHER): Payer: BC Managed Care – PPO | Admitting: Obstetrics and Gynecology

## 2012-01-16 ENCOUNTER — Ambulatory Visit (INDEPENDENT_AMBULATORY_CARE_PROVIDER_SITE_OTHER): Payer: BC Managed Care – PPO

## 2012-01-16 VITALS — BP 120/80 | Wt 248.0 lb

## 2012-01-16 DIAGNOSIS — Z1389 Encounter for screening for other disorder: Secondary | ICD-10-CM

## 2012-01-16 DIAGNOSIS — Z3689 Encounter for other specified antenatal screening: Secondary | ICD-10-CM

## 2012-01-16 NOTE — Progress Notes (Signed)
[redacted]w[redacted]d No complaints today. Pt desires cervix check. Wants to discuss C-section/Vaginal delivery. CBG'S on chart.

## 2012-01-16 NOTE — Progress Notes (Signed)
38 FBS 77-82 85-92 [place] today AFI 22.3 TVS pres Discussed with pt for 15 min R&B of cesarean.

## 2012-01-18 LAB — US OB LIMITED

## 2012-01-19 ENCOUNTER — Encounter (HOSPITAL_COMMUNITY)
Admission: RE | Admit: 2012-01-19 | Discharge: 2012-01-19 | Disposition: A | Discharge status: Home / Self Care | Payer: BC Managed Care – PPO | Source: Ambulatory Visit | Attending: Obstetrics and Gynecology | Admitting: Obstetrics and Gynecology

## 2012-01-19 ENCOUNTER — Encounter (HOSPITAL_COMMUNITY): Payer: Self-pay

## 2012-01-19 VITALS — BP 124/80 | Ht 64.5 in | Wt 250.0 lb

## 2012-01-19 DIAGNOSIS — O26899 Other specified pregnancy related conditions, unspecified trimester: Secondary | ICD-10-CM

## 2012-01-19 DIAGNOSIS — D259 Leiomyoma of uterus, unspecified: Secondary | ICD-10-CM

## 2012-01-19 DIAGNOSIS — Z6791 Unspecified blood type, Rh negative: Secondary | ICD-10-CM

## 2012-01-19 HISTORY — DX: Headache: R51

## 2012-01-19 HISTORY — DX: Heartburn: R12

## 2012-01-19 HISTORY — DX: Other specified pregnancy related conditions, unspecified trimester: O26.899

## 2012-01-19 LAB — PREPARE RBC (CROSSMATCH)

## 2012-01-19 LAB — RPR: RPR Ser Ql: NONREACTIVE

## 2012-01-19 LAB — CBC
HCT: 35.3 % — ABNORMAL LOW (ref 36.0–46.0)
MCHC: 32.3 g/dL (ref 30.0–36.0)
RDW: 15.3 % (ref 11.5–15.5)

## 2012-01-19 NOTE — Patient Instructions (Signed)
Your procedure is scheduled on:01/20/12 Enter through the Main Entrance at :12 noon Pick up desk phone and dial [phone] and inform us of your arrival.  Please call [phone] if you have any problems the morning of surgery.  Remember: Do not eat after midnight:tonight Clear liquids ok until 9am Fri  Take these meds the morning of surgery with a sip of water:NONE  DO NOT wear jewelry, eye make-up, lipstick,body lotion, or dark fingernail polish. Do not shave for 48 hours prior to surgery.  If you are to be admitted after surgery, leave suitcase in car until your room has been assigned.

## 2012-01-19 NOTE — Addendum Note (Signed)
Addended by: Hal Morales on: 01/19/2012 01:34 PM   Modules accepted: Orders

## 2012-01-20 ENCOUNTER — Inpatient Hospital Stay (HOSPITAL_COMMUNITY)
Admission: AD | Admit: 2012-01-20 | Discharge: 2012-01-23 | DRG: 370 | Disposition: A | Discharge status: Home / Self Care | Payer: BC Managed Care – PPO | Source: Ambulatory Visit | Attending: Obstetrics and Gynecology | Admitting: Obstetrics and Gynecology

## 2012-01-20 ENCOUNTER — Inpatient Hospital Stay (HOSPITAL_COMMUNITY): Payer: BC Managed Care – PPO | Admitting: Anesthesiology

## 2012-01-20 ENCOUNTER — Encounter (HOSPITAL_COMMUNITY): Payer: Self-pay | Admitting: Obstetrics and Gynecology

## 2012-01-20 ENCOUNTER — Encounter (HOSPITAL_COMMUNITY)
Admission: AD | Disposition: A | Discharge status: Home / Self Care | Payer: Self-pay | Source: Ambulatory Visit | Attending: Obstetrics and Gynecology

## 2012-01-20 ENCOUNTER — Encounter (HOSPITAL_COMMUNITY): Payer: Self-pay | Admitting: General Surgery

## 2012-01-20 ENCOUNTER — Encounter (HOSPITAL_COMMUNITY): Payer: Self-pay | Admitting: Anesthesiology

## 2012-01-20 DIAGNOSIS — D4959 Neoplasm of unspecified behavior of other genitourinary organ: Secondary | ICD-10-CM | POA: Diagnosis present

## 2012-01-20 DIAGNOSIS — O9903 Anemia complicating the puerperium: Secondary | ICD-10-CM | POA: Diagnosis not present

## 2012-01-20 DIAGNOSIS — O34599 Maternal care for other abnormalities of gravid uterus, unspecified trimester: Secondary | ICD-10-CM

## 2012-01-20 DIAGNOSIS — O3410 Maternal care for benign tumor of corpus uteri, unspecified trimester: Secondary | ICD-10-CM

## 2012-01-20 DIAGNOSIS — Z6791 Unspecified blood type, Rh negative: Secondary | ICD-10-CM

## 2012-01-20 DIAGNOSIS — D259 Leiomyoma of uterus, unspecified: Secondary | ICD-10-CM

## 2012-01-20 DIAGNOSIS — O99814 Abnormal glucose complicating childbirth: Secondary | ICD-10-CM | POA: Diagnosis present

## 2012-01-20 DIAGNOSIS — Z98891 History of uterine scar from previous surgery: Secondary | ICD-10-CM | POA: Diagnosis not present

## 2012-01-20 DIAGNOSIS — O360990 Maternal care for other rhesus isoimmunization, unspecified trimester, not applicable or unspecified: Secondary | ICD-10-CM

## 2012-01-20 DIAGNOSIS — O322XX0 Maternal care for transverse and oblique lie, not applicable or unspecified: Secondary | ICD-10-CM

## 2012-01-20 DIAGNOSIS — O320XX0 Maternal care for unstable lie, not applicable or unspecified: Principal | ICD-10-CM | POA: Diagnosis present

## 2012-01-20 DIAGNOSIS — D649 Anemia, unspecified: Secondary | ICD-10-CM | POA: Diagnosis not present

## 2012-01-20 DIAGNOSIS — Z01812 Encounter for preprocedural laboratory examination: Secondary | ICD-10-CM

## 2012-01-20 DIAGNOSIS — E669 Obesity, unspecified: Secondary | ICD-10-CM

## 2012-01-20 LAB — GLUCOSE, CAPILLARY: Glucose-Capillary: 87 mg/dL (ref 70–99)

## 2012-01-20 LAB — US OB FOLLOW UP

## 2012-01-20 SURGERY — Surgical Case
Anesthesia: Spinal | Site: Abdomen | Wound class: Clean Contaminated

## 2012-01-20 MED ORDER — BUPIVACAINE HCL (PF) 0.25 % IJ SOLN
INTRAMUSCULAR | Status: DC | PRN
  Administered 2012-01-20: 10 mL

## 2012-01-20 MED ORDER — NITROGLYCERIN 0.4 MG/SPRAY TL SOLN
Status: AC
  Filled 2012-01-20: qty 4.9

## 2012-01-20 MED ORDER — MEDROXYPROGESTERONE ACETATE 150 MG/ML IM SUSP: 150.0000 mg | INTRAMUSCULAR | Status: DC | PRN

## 2012-01-20 MED ORDER — DIPHENHYDRAMINE HCL 25 MG PO CAPS: 25.0000 mg | ORAL_CAPSULE | Freq: Four times a day (QID) | ORAL | Status: DC | PRN

## 2012-01-20 MED ORDER — VASOPRESSIN 20 UNIT/ML IJ SOLN
INTRAMUSCULAR | Status: AC
  Filled 2012-01-20: qty 1

## 2012-01-20 MED ORDER — FAMOTIDINE IN NACL 20-0.9 MG/50ML-% IV SOLN
20.0000 mg | Freq: Once | INTRAVENOUS | Status: DC
  Filled 2012-01-20: qty 50

## 2012-01-20 MED ORDER — PROPOFOL 10 MG/ML IV EMUL
INTRAVENOUS | Status: AC
  Filled 2012-01-20: qty 20

## 2012-01-20 MED ORDER — ONDANSETRON HCL 4 MG/2ML IJ SOLN
INTRAMUSCULAR | Status: DC | PRN
  Administered 2012-01-20: 4 mg via INTRAVENOUS

## 2012-01-20 MED ORDER — OXYCODONE-ACETAMINOPHEN 5-325 MG PO TABS
1.0000 | ORAL_TABLET | ORAL | Status: DC | PRN
  Filled 2012-01-20 (×2): qty 1

## 2012-01-20 MED ORDER — LIDOCAINE HCL (CARDIAC) 20 MG/ML IV SOLN
INTRAVENOUS | Status: AC
  Filled 2012-01-20: qty 5

## 2012-01-20 MED ORDER — DIPHENHYDRAMINE HCL 50 MG/ML IJ SOLN: 25.0000 mg | INTRAMUSCULAR | Status: DC | PRN

## 2012-01-20 MED ORDER — LANOLIN HYDROUS EX OINT: 1.0000 "application " | TOPICAL_OINTMENT | CUTANEOUS | Status: DC | PRN

## 2012-01-20 MED ORDER — NALOXONE HCL 0.4 MG/ML IJ SOLN: 0.4000 mg | INTRAMUSCULAR | Status: DC | PRN

## 2012-01-20 MED ORDER — DIPHENHYDRAMINE HCL 50 MG/ML IJ SOLN: 12.5000 mg | INTRAMUSCULAR | Status: DC | PRN

## 2012-01-20 MED ORDER — ZOLPIDEM TARTRATE 5 MG PO TABS: 5.0000 mg | ORAL_TABLET | Freq: Every evening | ORAL | Status: DC | PRN

## 2012-01-20 MED ORDER — MENTHOL 3 MG MT LOZG: 1.0000 | LOZENGE | OROMUCOSAL | Status: DC | PRN

## 2012-01-20 MED ORDER — SCOPOLAMINE 1 MG/3DAYS TD PT72
MEDICATED_PATCH | TRANSDERMAL | Status: AC
  Administered 2012-01-20: 1.5 mg via TRANSDERMAL
  Filled 2012-01-20: qty 1

## 2012-01-20 MED ORDER — ONDANSETRON HCL 4 MG PO TABS: 4.0000 mg | ORAL_TABLET | ORAL | Status: DC | PRN

## 2012-01-20 MED ORDER — NALBUPHINE HCL 10 MG/ML IJ SOLN: 5.0000 mg | INTRAMUSCULAR | Status: DC | PRN

## 2012-01-20 MED ORDER — CHLOROPROCAINE HCL 3 % IJ SOLN: INTRAMUSCULAR | Status: DC | PRN

## 2012-01-20 MED ORDER — NITROGLYCERIN 0.4 MG/SPRAY TL SOLN
Status: DC | PRN
  Administered 2012-01-20 (×2): 2 via SUBLINGUAL

## 2012-01-20 MED ORDER — DIPHENHYDRAMINE HCL 25 MG PO CAPS: 25.0000 mg | ORAL_CAPSULE | ORAL | Status: DC | PRN

## 2012-01-20 MED ORDER — NALOXONE HCL 1 MG/ML IJ SOLN: 1.0000 ug/kg/h | INTRAVENOUS | Status: DC | PRN

## 2012-01-20 MED ORDER — CEFAZOLIN SODIUM-DEXTROSE 2-3 GM-% IV SOLR
INTRAVENOUS | Status: AC
  Administered 2012-01-20: 2 g via INTRAVENOUS
  Filled 2012-01-20: qty 50

## 2012-01-20 MED ORDER — SENNOSIDES-DOCUSATE SODIUM 8.6-50 MG PO TABS
2.0000 | ORAL_TABLET | Freq: Every day | ORAL | Status: DC
  Administered 2012-01-21 – 2012-01-22 (×2): 2 via ORAL

## 2012-01-20 MED ORDER — FENTANYL CITRATE 0.05 MG/ML IJ SOLN
INTRAMUSCULAR | Status: AC
  Filled 2012-01-20: qty 5

## 2012-01-20 MED ORDER — SODIUM CHLORIDE 0.9 % IJ SOLN: 3.0000 mL | INTRAMUSCULAR | Status: DC | PRN

## 2012-01-20 MED ORDER — EPHEDRINE SULFATE 50 MG/ML IJ SOLN
INTRAMUSCULAR | Status: DC | PRN
  Administered 2012-01-20 (×2): 15 mg via INTRAVENOUS
  Administered 2012-01-20 (×2): 10 mg via INTRAVENOUS

## 2012-01-20 MED ORDER — ONDANSETRON HCL 4 MG/2ML IJ SOLN: 4.0000 mg | Freq: Three times a day (TID) | INTRAMUSCULAR | Status: DC | PRN

## 2012-01-20 MED ORDER — KETOROLAC TROMETHAMINE 30 MG/ML IJ SOLN
INTRAMUSCULAR | Status: AC
  Administered 2012-01-20: 30 mg via INTRAVENOUS
  Filled 2012-01-20: qty 1

## 2012-01-20 MED ORDER — MIDAZOLAM HCL 2 MG/2ML IJ SOLN: 0.5000 mg | Freq: Once | INTRAMUSCULAR | Status: DC | PRN

## 2012-01-20 MED ORDER — MORPHINE SULFATE 0.5 MG/ML IJ SOLN
INTRAMUSCULAR | Status: AC
  Filled 2012-01-20: qty 10

## 2012-01-20 MED ORDER — OXYTOCIN 10 UNIT/ML IJ SOLN
INTRAMUSCULAR | Status: AC
  Filled 2012-01-20: qty 4

## 2012-01-20 MED ORDER — MORPHINE SULFATE (PF) 0.5 MG/ML IJ SOLN
INTRAMUSCULAR | Status: DC | PRN
  Administered 2012-01-20: .15 mg via INTRATHECAL

## 2012-01-20 MED ORDER — FENTANYL CITRATE 0.05 MG/ML IJ SOLN: 25.0000 ug | INTRAMUSCULAR | Status: DC | PRN

## 2012-01-20 MED ORDER — PHENYLEPHRINE HCL 10 MG/ML IJ SOLN
INTRAMUSCULAR | Status: DC | PRN
  Administered 2012-01-20: 80 ug via INTRAVENOUS

## 2012-01-20 MED ORDER — TETANUS-DIPHTH-ACELL PERTUSSIS 5-2.5-18.5 LF-MCG/0.5 IM SUSP
0.5000 mL | Freq: Once | INTRAMUSCULAR | Status: AC
  Administered 2012-01-21: 0.5 mL via INTRAMUSCULAR
  Filled 2012-01-20: qty 0.5

## 2012-01-20 MED ORDER — IBUPROFEN 600 MG PO TABS
600.0000 mg | ORAL_TABLET | Freq: Four times a day (QID) | ORAL | Status: DC
  Administered 2012-01-21 – 2012-01-23 (×11): 600 mg via ORAL
  Filled 2012-01-20 (×10): qty 1

## 2012-01-20 MED ORDER — OXYTOCIN 10 UNIT/ML IJ SOLN
40.0000 [IU] | INTRAVENOUS | Status: DC | PRN
  Administered 2012-01-20: 40 [IU] via INTRAVENOUS

## 2012-01-20 MED ORDER — HYDROMORPHONE HCL PF 1 MG/ML IJ SOLN: 0.2500 mg | INTRAMUSCULAR | Status: DC | PRN

## 2012-01-20 MED ORDER — ONDANSETRON HCL 4 MG/2ML IJ SOLN
INTRAMUSCULAR | Status: AC
  Filled 2012-01-20: qty 2

## 2012-01-20 MED ORDER — PRENATAL MULTIVITAMIN CH
1.0000 | ORAL_TABLET | Freq: Every day | ORAL | Status: DC
  Administered 2012-01-21 – 2012-01-23 (×3): 1 via ORAL
  Filled 2012-01-20 (×3): qty 1

## 2012-01-20 MED ORDER — SCOPOLAMINE 1 MG/3DAYS TD PT72: 1.0000 | MEDICATED_PATCH | Freq: Once | TRANSDERMAL | Status: DC

## 2012-01-20 MED ORDER — PROMETHAZINE HCL 25 MG/ML IJ SOLN: 6.2500 mg | INTRAMUSCULAR | Status: DC | PRN

## 2012-01-20 MED ORDER — CEFAZOLIN SODIUM-DEXTROSE 2-3 GM-% IV SOLR: 2.0000 g | Freq: Once | INTRAVENOUS | Status: DC

## 2012-01-20 MED ORDER — METOCLOPRAMIDE HCL 5 MG/ML IJ SOLN: 10.0000 mg | Freq: Three times a day (TID) | INTRAMUSCULAR | Status: DC | PRN

## 2012-01-20 MED ORDER — FAMOTIDINE 10 MG/ML IV SOLN: 40.0000 mg | INTRAVENOUS | Status: DC | PRN

## 2012-01-20 MED ORDER — FENTANYL CITRATE 0.05 MG/ML IJ SOLN
INTRAMUSCULAR | Status: DC | PRN
  Administered 2012-01-20 (×2): 25 ug via INTRAVENOUS
  Administered 2012-01-20: 25 ug via INTRATHECAL
  Administered 2012-01-20 (×3): 25 ug via INTRAVENOUS

## 2012-01-20 MED ORDER — KETOROLAC TROMETHAMINE 30 MG/ML IJ SOLN: 30.0000 mg | Freq: Four times a day (QID) | INTRAMUSCULAR | Status: DC | PRN

## 2012-01-20 MED ORDER — KETOROLAC TROMETHAMINE 30 MG/ML IJ SOLN: 30.0000 mg | Freq: Four times a day (QID) | INTRAMUSCULAR | Status: AC | PRN

## 2012-01-20 MED ORDER — FAMOTIDINE 10 MG PO TABS: ORAL_TABLET | ORAL | Status: DC | PRN

## 2012-01-20 MED ORDER — CHLOROPROCAINE HCL 3 % IJ SOLN
INTRAMUSCULAR | Status: AC
  Filled 2012-01-20: qty 40

## 2012-01-20 MED ORDER — WITCH HAZEL-GLYCERIN EX PADS: 1.0000 "application " | MEDICATED_PAD | CUTANEOUS | Status: DC | PRN

## 2012-01-20 MED ORDER — CHLOROPROCAINE HCL 3 % IJ SOLN
INTRAMUSCULAR | Status: DC | PRN
  Administered 2012-01-20 (×2): 20 mL

## 2012-01-20 MED ORDER — VASOPRESSIN 20 UNIT/ML IJ SOLN
INTRAVENOUS | Status: DC | PRN
  Administered 2012-01-20: 15:00:00 via INTRAMUSCULAR

## 2012-01-20 MED ORDER — FAMOTIDINE IN NACL 20-0.9 MG/50ML-% IV SOLN
INTRAVENOUS | Status: DC | PRN
  Administered 2012-01-20: 20 mg via INTRAVENOUS

## 2012-01-20 MED ORDER — LACTATED RINGERS IV SOLN
INTRAVENOUS | Status: DC
  Administered 2012-01-20 (×4): via INTRAVENOUS

## 2012-01-20 MED ORDER — LACTATED RINGERS IV SOLN: INTRAVENOUS | Status: DC

## 2012-01-20 MED ORDER — KETOROLAC TROMETHAMINE 60 MG/2ML IM SOLN: 60.0000 mg | Freq: Once | INTRAMUSCULAR | Status: DC | PRN

## 2012-01-20 MED ORDER — DIBUCAINE 1 % RE OINT: 1.0000 "application " | TOPICAL_OINTMENT | RECTAL | Status: DC | PRN

## 2012-01-20 MED ORDER — SIMETHICONE 80 MG PO CHEW
80.0000 mg | CHEWABLE_TABLET | Freq: Three times a day (TID) | ORAL | Status: DC
  Administered 2012-01-21 – 2012-01-22 (×8): 80 mg via ORAL

## 2012-01-20 MED ORDER — MEPERIDINE HCL 25 MG/ML IJ SOLN: 6.2500 mg | INTRAMUSCULAR | Status: DC | PRN

## 2012-01-20 MED ORDER — OXYTOCIN 40 UNITS IN LACTATED RINGERS INFUSION - SIMPLE MED: 62.5000 mL/h | INTRAVENOUS | Status: AC

## 2012-01-20 MED ORDER — ACETAMINOPHEN 10 MG/ML IV SOLN: 1000.0000 mg | Freq: Four times a day (QID) | INTRAVENOUS | Status: AC | PRN

## 2012-01-20 MED ORDER — FENTANYL CITRATE 0.05 MG/ML IJ SOLN
INTRAMUSCULAR | Status: AC
  Filled 2012-01-20: qty 2

## 2012-01-20 MED ORDER — SCOPOLAMINE 1 MG/3DAYS TD PT72
1.0000 | MEDICATED_PATCH | Freq: Once | TRANSDERMAL | Status: DC
  Administered 2012-01-20: 1.5 mg via TRANSDERMAL

## 2012-01-20 MED ORDER — SIMETHICONE 80 MG PO CHEW: 80.0000 mg | CHEWABLE_TABLET | ORAL | Status: DC | PRN

## 2012-01-20 MED ORDER — ONDANSETRON HCL 4 MG/2ML IJ SOLN: 4.0000 mg | INTRAMUSCULAR | Status: DC | PRN

## 2012-01-20 MED ORDER — BUPIVACAINE HCL (PF) 0.25 % IJ SOLN
INTRAMUSCULAR | Status: AC
  Filled 2012-01-20: qty 30

## 2012-01-20 MED ORDER — IBUPROFEN 600 MG PO TABS: 600.0000 mg | ORAL_TABLET | Freq: Four times a day (QID) | ORAL | Status: DC | PRN

## 2012-01-20 MED ORDER — MIDAZOLAM HCL 2 MG/2ML IJ SOLN
INTRAMUSCULAR | Status: AC
  Filled 2012-01-20: qty 2

## 2012-01-20 MED ORDER — CEFAZOLIN SODIUM-DEXTROSE 2-3 GM-% IV SOLR: 2.0000 g | INTRAVENOUS | Status: DC

## 2012-01-20 SURGICAL SUPPLY — 48 items
BENZOIN TINCTURE PRP APPL 2/3 (GAUZE/BANDAGES/DRESSINGS) IMPLANT
BLADE EXTENDED COATED 6.5IN (ELECTRODE) IMPLANT
BLADE HEX COATED 2.75 (ELECTRODE) IMPLANT
BOOTIES KNEE HIGH SLOAN (MISCELLANEOUS) ×4 IMPLANT
CLOTH BEACON ORANGE TIMEOUT ST (SAFETY) ×2 IMPLANT
CONTAINER PREFILL 10% NBF 15ML (MISCELLANEOUS) ×4 IMPLANT
DERMABOND ADVANCED (GAUZE/BANDAGES/DRESSINGS)
DERMABOND ADVANCED .7 DNX12 (GAUZE/BANDAGES/DRESSINGS) IMPLANT
DRAIN JACKSON PRT FLT 7MM (DRAIN) IMPLANT
DRAPE LG THREE QUARTER DISP (DRAPES) ×2 IMPLANT
DRESSING TELFA 8X3 (GAUZE/BANDAGES/DRESSINGS) ×2 IMPLANT
DRSG OPSITE POSTOP 4X10 (GAUZE/BANDAGES/DRESSINGS) ×2 IMPLANT
DURAPREP 26ML APPLICATOR (WOUND CARE) ×2 IMPLANT
ELECT REM PT RETURN 9FT ADLT (ELECTROSURGICAL) ×2
ELECTRODE REM PT RTRN 9FT ADLT (ELECTROSURGICAL) ×1 IMPLANT
EVACUATOR SILICONE 100CC (DRAIN) IMPLANT
GAUZE SPONGE 4X4 12PLY STRL LF (GAUZE/BANDAGES/DRESSINGS) ×2 IMPLANT
GLOVE SURG SS PI 6.5 STRL IVOR (GLOVE) ×4 IMPLANT
GOWN PREVENTION PLUS LG XLONG (DISPOSABLE) ×6 IMPLANT
KIT ABG SYR 3ML LUER SLIP (SYRINGE) IMPLANT
NEEDLE HYPO 25X5/8 SAFETYGLIDE (NEEDLE) ×2 IMPLANT
NEEDLE SPNL 22GX3.5 QUINCKE BK (NEEDLE) ×4 IMPLANT
NS IRRIG 1000ML POUR BTL (IV SOLUTION) ×2 IMPLANT
PACK C SECTION WH (CUSTOM PROCEDURE TRAY) ×2 IMPLANT
PAD ABD 7.5X8 STRL (GAUZE/BANDAGES/DRESSINGS) ×2 IMPLANT
PAD OB MATERNITY 4.3X12.25 (PERSONAL CARE ITEMS) ×2 IMPLANT
STRIP CLOSURE SKIN 1/4X4 (GAUZE/BANDAGES/DRESSINGS) IMPLANT
SUT CHROMIC 2 0 SH (SUTURE) ×2 IMPLANT
SUT MNCRL AB 3-0 PS2 27 (SUTURE) ×2 IMPLANT
SUT PLAIN 2 0 XLH (SUTURE) ×2 IMPLANT
SUT SILK 0 FSL (SUTURE) IMPLANT
SUT VIC AB 0 CT1 18XCR BRD8 (SUTURE) ×1 IMPLANT
SUT VIC AB 0 CT1 27 (SUTURE) ×6
SUT VIC AB 0 CT1 27XBRD ANBCTR (SUTURE) ×6 IMPLANT
SUT VIC AB 0 CT1 36 (SUTURE) IMPLANT
SUT VIC AB 0 CT1 8-18 (SUTURE) ×1
SUT VIC AB 0 CTX 36 (SUTURE) ×4
SUT VIC AB 0 CTX36XBRD ANBCTRL (SUTURE) ×4 IMPLANT
SUT VIC AB 0 CTXB 36 (SUTURE) IMPLANT
SUT VIC AB 2-0 CT1 27 (SUTURE) ×2
SUT VIC AB 2-0 CT1 TAPERPNT 27 (SUTURE) ×2 IMPLANT
SUT VIC AB 2-0 SH 27 (SUTURE)
SUT VIC AB 2-0 SH 27XBRD (SUTURE) IMPLANT
SYR CONTROL 10ML LL (SYRINGE) ×4 IMPLANT
TAPE CLOTH SURG 4X10 WHT LF (GAUZE/BANDAGES/DRESSINGS) ×2 IMPLANT
TOWEL OR 17X24 6PK STRL BLUE (TOWEL DISPOSABLE) ×6 IMPLANT
TRAY FOLEY CATH 14FR (SET/KITS/TRAYS/PACK) ×2 IMPLANT
WATER STERILE IRR 1000ML POUR (IV SOLUTION) ×2 IMPLANT

## 2012-01-20 NOTE — Anesthesia Postprocedure Evaluation (Signed)
  Anesthesia Post-op Note  Patient: Emily Moss  Procedure(s) Performed: Procedure(s) (LRB) with comments: CESAREAN SECTION (N/A)  Patient is awake, responsive, moving her legs, and has signs of resolution of her numbness. Pain and nausea are reasonably well controlled. Vital signs are stable and clinically acceptable. Oxygen saturation is clinically acceptable. There are no apparent anesthetic complications at this time. Patient is ready for discharge.

## 2012-01-20 NOTE — Transfer of Care (Signed)
Immediate Anesthesia Transfer of Care Note  Patient: Emily Moss  Procedure(s) Performed: Procedure(s) (LRB) with comments: CESAREAN SECTION (N/A)  Patient Location: PACU  Anesthesia Type:Spinal  Level of Consciousness: sedated  Airway & Oxygen Therapy: Patient Spontanous Breathing  Post-op Assessment: Report given to PACU RN  Post vital signs: Reviewed and stable  Complications: No apparent anesthesia complications

## 2012-01-20 NOTE — Anesthesia Preprocedure Evaluation (Signed)
Anesthesia Evaluation  Patient identified by MRN, date of birth, ID band Patient awake    Reviewed: Allergy & Precautions, H&P , NPO status , Patient's Chart, lab work & pertinent test results  Airway Mallampati: III      Dental No notable dental hx.    Pulmonary neg pulmonary ROS, asthma , Recent URI ,  breath sounds clear to auscultation  Pulmonary exam normal       Cardiovascular Exercise Tolerance: Good negative cardio ROS  + Valvular Problems/Murmurs Rhythm:regular Rate:Normal     Neuro/Psych  Headaches, negative neurological ROS  negative psych ROS   GI/Hepatic negative GI ROS, Neg liver ROS,   Endo/Other  negative endocrine ROSdiabetesMorbid obesity  Renal/GU negative Renal ROS  negative genitourinary   Musculoskeletal   Abdominal Normal abdominal exam  (+)   Peds  Hematology negative hematology ROS (+)   Anesthesia Other Findings Recurrent upper respiratory infection (URI)   RECURRENT BRONCHITIS Abnormal Pap smear 2009 LAST PAP 05/2011    Infection   UTI X 1 Infection 2000, 2002,2008 CHLAMYDIA    Fibroid 2010   Eczema        H/O varicella     Asthma   CHILDHOOD    Diabetes mellitus   gestational- diet controlled Heart murmur   AT BIRTH RESOLVED    Heartburn in pregnancy     Headache   one severe headache during pregnancy     Reproductive/Obstetrics (+) Pregnancy                           Anesthesia Physical Anesthesia Plan  ASA: III  Anesthesia Plan: Spinal   Post-op Pain Management:    Induction:   Airway Management Planned:   Additional Equipment:   Intra-op Plan:   Post-operative Plan:   Informed Consent: I have reviewed the patients History and Physical, chart, labs and discussed the procedure including the risks, benefits and alternatives for the proposed anesthesia with the patient or authorized representative who has indicated his/her understanding and  acceptance.     Plan Discussed with: Anesthesiologist, CRNA and Surgeon  Anesthesia Plan Comments:         Anesthesia Quick Evaluation

## 2012-01-20 NOTE — H&P (Signed)
Emily Moss is a 31 y.o. female presenting for primary cesarean section. Denies srom, vag bleeding, with +FM. Dating by LMP with [facility] 12/20. Taking PNV .History OB History    Grav Para Term Preterm Abortions TAB SAB Ect Mult Living   1         0     Past Medical History  Diagnosis Date  . Recurrent upper respiratory infection (URI)     RECURRENT BRONCHITIS  . Abnormal Pap smear 2009    LAST PAP 05/2011  . Infection     UTI X 1  . Infection 2000, [id number]    CHLAMYDIA  . Fibroid 2010  . Eczema   . H/O varicella   . Asthma     CHILDHOOD  . Diabetes mellitus     gestational- diet controlled  . Heart murmur     AT BIRTH RESOLVED  . Heartburn in pregnancy   . Headache     one severe headache during pregnancy   obesity Past Surgical History  Procedure Date  . Wisdom tooth extraction 2011    x 2  . No past surgeries    Family History: family history includes Alcohol abuse in her maternal grandfather and maternal uncle; Alzheimer's disease in her maternal grandfather; Asthma in her other; Cancer in her cousins; Cancer (age of onset:58) in her mother; Diabetes in her mother; Drug abuse in her brother, cousin, and sister; Hypertension in her mother; Lupus in her paternal grandmother; and Stroke in her cousin. Social History:  reports that she quit smoking about 10 months ago. Her smoking use included Pipe and Cigars. She has never used smokeless tobacco. She reports that she does not drink alcohol or use illicit drugs.   Prenatal Transfer Tool  Maternal Diabetes: Yes:  Diabetes Type:  Diet controlled Genetic Screening: Normal Maternal Ultrasounds/Referrals: Abnormal:  Findings:   Other: transverse, with large fibroid, posterior placenta Fetal Ultrasounds or other Referrals:  None Maternal Substance Abuse:  No Significant Maternal Medications:  None Significant Maternal Lab Results:  Lab values include: Group B Strep negative Other Comments:  None  ROS    Blood pressure  142/90, pulse 92, temperature 97.5 F (36.4 C), temperature source Oral, resp. rate 16, last menstrual period 04/22/2011, SpO2 100.00%. Exam Physical Exam  Calm, no distress, cooperative, HEENT WNL grossly,  lungs clear bilaterally, AP RRR,  abd soft nt,no masses,  bowel sounds active,  +1 nonpitting edema to lower extremities Vag not assessed  Results for orders placed during the hospital encounter of 01/19/12 (from the past 48 hour(s))  TYPE AND SCREEN     Status: Normal (Preliminary result)   Collection Time   01/19/12 10:04 AM      Component Value Range Comment   ABO/RH(D) O NEG      Antibody Screen POS      Sample Expiration 01/22/2012      Antibody Identification PASSIVELY ACQUIRED ANTI-D      DAT, IgG NEG      Unit Number [id number]      Blood Component Type RBC LR PHER1      Unit division 00      Status of Unit ALLOCATED      Transfusion Status OK TO TRANSFUSE      Crossmatch Result COMPATIBLE      Unit Number [id number]      Blood Component Type RED CELLS,LR      Unit division 00      Status of Unit ALLOCATED  Transfusion Status OK TO TRANSFUSE      Crossmatch Result COMPATIBLE     CBC     Status: Abnormal   Collection Time   01/19/12 10:05 AM      Component Value Range Comment   WBC 5.0  4.0 - 10.5 K/uL    RBC 4.53  3.87 - 5.11 MIL/uL    Hemoglobin 11.4 (*) 12.0 - 15.0 g/dL    HCT 29.5 (*) 28.4 - 46.0 %    MCV 77.9 (*) 78.0 - 100.0 fL    MCH 25.2 (*) 26.0 - 34.0 pg    MCHC 32.3  30.0 - 36.0 g/dL    RDW 13.2  44.0 - 10.2 %    Platelets 180  150 - 400 K/uL REPEATED TO VERIFY  RPR     Status: Normal   Collection Time   01/19/12 10:05 AM      Component Value Range Comment   RPR NON REACTIVE  NON REACTIVE   PREPARE RBC (CROSSMATCH)     Status: Normal   Collection Time   01/19/12 10:05 AM      Component Value Range Comment   Order Confirmation ORDER PROCESSED BY BLOOD BANK     SURGICAL PCR SCREEN     Status: Normal   Collection Time   01/19/12  10:05 AM      Component Value Range Comment   MRSA, PCR NEGATIVE  NEGATIVE    Staphylococcus aureus NEGATIVE  NEGATIVE    Prenatal labs: ABO, Rh: --/--/O NEG (12/12 1004) Antibody: POS (12/12 1004) Rubella: 90.2 (05/07 1444) RPR: NON REACTIVE (12/12 1005)  HBsAg: NEGATIVE (05/07 1444)  HIV: NON REACTIVE (05/07 1444)  GBS: NEGATIVE (11/21 1624)   Assessment/Plan: [redacted]w[redacted]d Transverse presentation with unstable lie over the last few weeks Large fibroid in lower uterine segment GBD-diet Plan primary cesarean discussed risks of injury to surrounding organs, , infection, and particularly risk of hemorrhage because of the large fibroid.  We have typed and crossmatched blood for placement in the OR for immediate use if needed.The pt has had her questions answered and wishes to proceed  [facility], [facility] 01/20/2012, 12:48 PM

## 2012-01-20 NOTE — Anesthesia Procedure Notes (Signed)

## 2012-01-20 NOTE — Op Note (Signed)
Cesarean Section Procedure Note  Indications: malpresentation: Unstable lie currently transverse  Pre-operative Diagnosis: 39 week 0 day pregnancy.  Post-operative Diagnosis: same  Surgeon: Hal Morales  First Assistant:  Surgeon: Hal Morales   Assistants: Leonard Schwartz M.D.  Anesthesia: Spinal anesthesia  ASA Class: 2  Procedure Details  The patient was seen in the Holding Room. The risks, benefits, complications, treatment options, and expected outcomes were discussed with the patient.  The patient concurred with the proposed plan, giving informed consent.  The site of surgery properly noted/marked. The patient was taken to Operating Room # 9, identified as Emily Moss and the procedure verified as C-Section Delivery. A Time Out was held and the above information confirmed.  After induction of anesthesia, the patient was  prepped with chlor prep in the usual sterile manner.A foley catheter was placed under sterile conditions.  The patient was then draped in the usual fashion.   Suprapubicsubcutaneous injection of 0.25% Bupivacaine   A Pfannenstiel incision was made and carried down through the subcutaneous tissue to the fascia. Fascial incision was made and extended transversely. The fascia was separated from the underlying rectus tissue superiorly and inferiorly. The peritoneum was identified and entered. Peritoneal incision was extended longitudinally.  An Alexis retractor was placed.  The lower uterine segment was obscured by a 10 x 15 cm degenerating fibroid. The endometrial cavity could not be entered either transversely no or vertically without removal of the anterior fibroid. The serosa was incised down to the level of the fibroid and with the combination of blunt and sharp dissection the large degenerating fibroid was removed. The actual endometrial cavity was noted to be cephalad to the myometrial defect. A vertical incision was then made into the endometrial  cavity and extended cephalad.  The membranes were ruptured and the infant noted to be in the transverse back down position.  The feet were grasped and brought through the incision, and with a combination of gentle traction and fundal pressure the buttocks and abdomen delivered down to the level of the umbilical cord appeared the left arm was swept in front of the body for her delivery as was the right and the head easily delivered through the vertical incision. The infant was noted to have cord wrapped around the neck and the body.  The cord was clamped and cut and the infant was handed off to the awaiting pediatricians. She was a 7 pound 7.9 pounds female infant with Apgars of 6 and 8 at one and 5 minutes respectively. The placenta was removed intact and appeared normal. The  tubes and ovaries appeared norma for the gravid state. The uterine incision was closed with a row of figure of 8 sutures of 0 Vicryl. An imbricating layer of sutures was placed. Hemostasis was observed. The large uterine defect status post myomectomy was closed with interrupted figure-of-eight sutures of 0 Vicryl in 2 layers. A serosal layer was then placed in a running fashion with good hemostasis.  Lavage was carried out until clear. The peritoneum was closed with a running suture of 2-0 Vicryl.  The rectus muscles were reapproximated with a figure of 8 suture of 2-0 Vicryl.  The fascia was then reapproximated with a running sutures of 0 Vicryl .Renforcing figure of 8 sutures of 0Vicryl were placed on either side of midline.   The skin was reapproximated with 3-0 moncryl.  A sterile dressing was applied.  Instrument, sponge, and needle counts were correct prior to the abdominal closure and at  the conclusion of the case.   Findings:  Placenta contained a 3 vessel cord    Estimated Blood Loss:  1100 cc         Drains: None         Total IV Fluids: 3000 ml         Specimens: 10 x 15 cm degenerated fibroid sent to pathology             Placenta sent to [facility]         Implants: None         Complications: ::"None; patient tolerated the procedure well."         Disposition: PACU - hemodynamically stable. the infant went to the full-term nursery         Condition: stable  Attending Attestation: I performed the procedure.  Emily Moss P  01/20/2012 7:17 PM

## 2012-01-21 LAB — CBC
HCT: 23.6 % — ABNORMAL LOW (ref 36.0–46.0)
Hemoglobin: 7.8 g/dL — ABNORMAL LOW (ref 12.0–15.0)
RDW: 15.3 % (ref 11.5–15.5)
WBC: 9.2 10*3/uL (ref 4.0–10.5)

## 2012-01-21 NOTE — Addendum Note (Signed)
Addendum  created 01/21/12 1059 by Lincoln Brigham, CRNA   Modules edited:Notes Section

## 2012-01-21 NOTE — Anesthesia Postprocedure Evaluation (Signed)
  Anesthesia Post-op Note  Patient: Emily Moss  Procedure(s) Performed: Procedure(s) (LRB) with comments: CESAREAN SECTION (N/A)  Patient Location: Mother/Baby  Anesthesia Type:Spinal  Level of Consciousness: awake, alert  and oriented  Airway and Oxygen Therapy: Patient Spontanous Breathing  Post-op Pain: mild  Post-op Assessment: Patient's Cardiovascular Status Stable, Respiratory Function Stable, Patent Airway, No signs of Nausea or vomiting, Adequate PO intake and Pain level controlled  Post-op Vital Signs: stable  Complications: No apparent anesthesia complications

## 2012-01-21 NOTE — Progress Notes (Addendum)
Subjective: Postpartum Day 1: Cesarean Delivery Patient reports feeling well.  Reports incisional pain is well controlled.  Tol po liquids and solids without difficulty.  Pos flatus, neg BM.  Foley remains in place.  Has ambulated x 1 and states she had some dizziness just prior to returning to bed but no syncope and no symptoms otherwise.  Working on breastfeeding.      Objective: Vital signs in last 24 hours: Temp:  [97.3 F (36.3 C)-98.9 F (37.2 C)] 97.9 F (36.6 C) (12/14 0600) Pulse Rate:  [69-98] 98  (12/14 0600) Resp:  [15-20] 18  (12/14 0600) BP: (95-142)/(59-90) 100/64 mmHg (12/14 0600) SpO2:  [97 %-100 %] 98 % (12/14 0600) Weight:  [250 lb (113.399 kg)] 250 lb (113.399 kg) (12/13 1900) Filed Vitals:   01/21/12 0448 01/21/12 0449 01/21/12 0450 01/21/12 0600  BP: 106/69 105/67 95/61 100/64  Pulse: 69   98  Temp: 98.9 F (37.2 C)   97.9 F (36.6 C)  TempSrc: Oral   Oral  Resp: 18   18  Weight:      SpO2: 98%   98%   Filed Vitals:   01/21/12 1005 01/21/12 1010 01/21/12 1012 01/21/12 1021  BP: 108/67 118/70 119/71 135/71  Pulse: 92 98 108 120  Temp: 99.1 F (37.3 C)     TempSrc: Oral     Resp: 16     Weight:      SpO2: 98%      Physical Exam:  General: alert, cooperative and no distress Heart:  RRR Lungs:  CTA bilat Abd:  Soft, NT with pos BS x 4 quads  Lochia: appropriate, sm rubra Uterine Fundus: firm, NT 1 below umb Incision: Dsg clean, dry, intact without drainage noted. DVT Evaluation: No evidence of DVT seen on physical exam. Negative Homan's sign bilat. No significant calf/ankle edema.   Basename 01/21/12 0537 01/19/12 1005  HGB 7.8* 11.4*  HCT 23.6* 35.3*    Assessment/Plan: Status post Cesarean section. Doing well postoperatively.  Continue current care Postoperative anemia - currently asymptomatic.  RBA transfusion d/w pt and declines at the present time since orthostatic vitals are stable and she is essentially asymptomatic. Pt wants to  do placental encapsulation.  OR notified.  SMITH,NONA O. 01/21/2012, 9:51 AM

## 2012-01-22 DIAGNOSIS — Z98891 History of uterine scar from previous surgery: Secondary | ICD-10-CM | POA: Diagnosis not present

## 2012-01-22 NOTE — Discharge Summary (Signed)
Cesarean Section Delivery Discharge Summary  Emily Moss  DOB:    [birth year] MRN:    [id number] CSN:    [id number]  Date of admission:                  01/20/12  Date of discharge:                   01/23/12  Procedures this admission: Primary LTCS due to variable presentation of fetus, with vertical incision on uterus Myomectomy required to allow delivery of fetus  Newborn Data:  Live born female  Birth Weight: 7 lb 7.9 oz (3399 g) APGAR: 6, 8  Home with mother. Name: ?  History of Present Illness:  Emily Moss is a 31 y.o. female, G1P1001, who presents at [redacted]w[redacted]d weeks gestation. The patient has been followed at the [facility] and Gynecology division of [facility].    Her pregnancy has been complicated by:  Patient Active Problem List  Diagnosis  . Headache  . Rh negative state in antepartum period  . Gestational diabetes mellitus in pregnancy  . Obesity  . Status post primary low transverse cesarean section, with myomectomy  . Cesarean delivery delivered    Hospital course:  The patient was admitted for primary cesarean delivery due to unstable lie/variable presentation, with a known large fibroid.   Dr. Pennie Rushing performed the C/S, with Dr. Stefano Gaul assisting due to the large fibroid.  A 10 x 15 cm fibroid was noted and required removal before access to the baby could be obtained.  The uterus required a vertical extension for delivery of the baby.  She tolerated the procedure well and the baby was maintained under [employer] care.  Her postpartum course was complicated by anemia, with Hgb down to 7.8 on pp day 1 (down from 11.4 on the day of surgery).  Her orthostatic VS were stable, and she declined transfusion.  She was able to be up ad lib without syncope or dizziness.  She was discharged to home on postpartum day 3 doing well.    Feeding:  breast  Contraception:  Undecided at present--options reviewed  Discharge  hemoglobin:  Hemoglobin  Date Value Range Status  01/21/2012 7.8* 12.0 - 15.0 g/dL Final     DELTA CHECK NOTED     REPEATED TO VERIFY     HCT  Date Value Range Status  01/21/2012 23.6* 36.0 - 46.0 % Final    Discharge Physical Exam:   General: alert, pale Lochia: appropriate Uterine Fundus: firm Incision: Honeycomb dressing CDI DVT Evaluation: No evidence of DVT seen on physical exam. Negative Homan's sign.  Intrapartum Procedures: cesarean: low cervical, vertical due to large fibroid, with removal of fibroid prior to delivery to allow for fetal extraction Postpartum Procedures: none Complications-Operative and Postpartum: Anemia, but hemodynamically stable  Discharge Diagnoses: Term Pregnancy-delivered and large fibroid, removed during C/S, anemia  Discharge Information:  Activity:           Per [facility] handout Diet:                routine Medications: Ibuprofen, Percocet and FeSO4 Condition:      stable Instructions:  refer to practice specific booklet Discharge to: home  Follow-up Information    Follow up with [facility]. Schedule an appointment as soon as possible for a visit in 6 weeks. (Call with any questions or concerns)    Contact information:   [address]  [address] [place] [address] [phone]          Nigel Bridgeman 01/23/2012

## 2012-01-22 NOTE — Progress Notes (Signed)
Subjective: Postpartum Day 2: Cesarean Delivery due to unstable lie, removal of large fibroid required to accomplish delivery. Patient up ad lib, reports no syncope or dizziness. Feeding:  Breast Contraceptive plan:  Undecided (conceived when missed 2 pills)  Objective: Vital signs in last 24 hours: Temp:  [98.3 F (36.8 C)-99.1 F (37.3 C)] 98.3 F (36.8 C) (12/15 0600) Pulse Rate:  [92-120] 105  (12/15 0600) Resp:  [16-18] 18  (12/15 0600) BP: (108-135)/(67-76) 127/76 mmHg (12/15 0600) SpO2:  [97 %-98 %] 97 % (12/14 1430)  Physical Exam:  General: alert Lochia: appropriate Uterine Fundus: firm Incision: Dressing CDI DVT Evaluation: No evidence of DVT seen on physical exam, negative Homan's JP drain:   NA   Basename 01/21/12 0537 01/19/12 1005  HGB 7.8* 11.4*  HCT 23.6* 35.3*  Orthostatics stable yesterday and today.   Assessment/Plan: Status post Cesarean section day 2. Doing well postoperatively. Anemia, but hemodynamically stable  Continue current care. Plan for discharge tomorrow Reviewed options for contraception--patient will consider and decide by 6 weeks.    Emily Moss 01/22/2012, 9:08 AM

## 2012-01-23 ENCOUNTER — Encounter (HOSPITAL_COMMUNITY): Payer: Self-pay | Admitting: Obstetrics and Gynecology

## 2012-01-23 LAB — TYPE AND SCREEN
Antibody Screen: POSITIVE
DAT, IgG: NEGATIVE
Unit division: 0

## 2012-01-23 MED ORDER — OXYCODONE-ACETAMINOPHEN 5-325 MG PO TABS: 1.0000 | ORAL_TABLET | ORAL | Status: DC | PRN

## 2012-01-23 MED ORDER — IBUPROFEN 600 MG PO TABS: 600.0000 mg | ORAL_TABLET | Freq: Four times a day (QID) | ORAL | Status: DC | PRN

## 2012-01-23 MED ORDER — FERROUS SULFATE 325 (65 FE) MG PO TABS: 325.0000 mg | ORAL_TABLET | Freq: Two times a day (BID) | ORAL | Status: DC

## 2012-02-27 ENCOUNTER — Ambulatory Visit: Payer: BC Managed Care – PPO | Admitting: Obstetrics and Gynecology

## 2012-02-27 ENCOUNTER — Encounter: Payer: Self-pay | Admitting: Obstetrics and Gynecology

## 2012-02-27 NOTE — Progress Notes (Signed)
Date of delivery: 01/19/13 Female Name: Emily Moss Vaginal delivery:no Cesarean section:yes Tubal ligation:no GDM:yes Breast Feeding:yes Bottle Feeding:yes Post-Partum Blues:no Abnormal pap:yes Normal GU function: yes Normal GI function:yes Returning to work:no EPDS: 4 BP 100/60  Ht 5\' 6"  (1.676 m)  Wt 217 lb (98.431 kg)  BMI 35.02 kg/m2  Breastfeeding? Yes  female who presents for a postpartum visit.  ABD: soft nontender. Incision CDI GU: vulva normal no masses seen.  Vagina normal in appearance.  Cervix is parous and NT.  Uterus normal size.  No adnexal tenderness bilaterally or fullness EXT: no CCEB condoms May resume intercourse , exercise and normal activity RT 4/14 for aex Pt with vertical incision on her uterus.  She must alwas have a cesarean

## 2012-03-02 ENCOUNTER — Ambulatory Visit (HOSPITAL_COMMUNITY): Payer: BC Managed Care – PPO

## 2013-12-09 ENCOUNTER — Encounter: Payer: Self-pay | Admitting: Obstetrics and Gynecology

## 2016-03-24 DIAGNOSIS — Z01419 Encounter for gynecological examination (general) (routine) without abnormal findings: Secondary | ICD-10-CM | POA: Diagnosis not present

## 2016-03-24 DIAGNOSIS — Z6836 Body mass index (BMI) 36.0-36.9, adult: Secondary | ICD-10-CM | POA: Diagnosis not present

## 2016-03-24 DIAGNOSIS — Z3040 Encounter for surveillance of contraceptives, unspecified: Secondary | ICD-10-CM | POA: Diagnosis not present

## 2016-10-19 DIAGNOSIS — Z3201 Encounter for pregnancy test, result positive: Secondary | ICD-10-CM | POA: Diagnosis not present

## 2016-10-19 DIAGNOSIS — Z3400 Encounter for supervision of normal first pregnancy, unspecified trimester: Secondary | ICD-10-CM | POA: Diagnosis not present

## 2016-10-19 LAB — OB RESULTS CONSOLE HIV ANTIBODY (ROUTINE TESTING): HIV: NONREACTIVE

## 2016-10-19 LAB — OB RESULTS CONSOLE GC/CHLAMYDIA
Chlamydia: NEGATIVE
Gonorrhea: NEGATIVE

## 2016-10-19 LAB — OB RESULTS CONSOLE ABO/RH: RH TYPE: NEGATIVE

## 2016-10-19 LAB — OB RESULTS CONSOLE RPR: RPR: NONREACTIVE

## 2016-10-19 LAB — OB RESULTS CONSOLE HEPATITIS B SURFACE ANTIGEN: Hepatitis B Surface Ag: NEGATIVE

## 2016-10-19 LAB — OB RESULTS CONSOLE ANTIBODY SCREEN: Antibody Screen: POSITIVE

## 2016-10-19 LAB — OB RESULTS CONSOLE RUBELLA ANTIBODY, IGM: RUBELLA: IMMUNE

## 2016-10-26 DIAGNOSIS — O09521 Supervision of elderly multigravida, first trimester: Secondary | ICD-10-CM | POA: Diagnosis not present

## 2016-10-26 DIAGNOSIS — Z3A09 9 weeks gestation of pregnancy: Secondary | ICD-10-CM | POA: Diagnosis not present

## 2016-10-26 DIAGNOSIS — O209 Hemorrhage in early pregnancy, unspecified: Secondary | ICD-10-CM | POA: Diagnosis not present

## 2016-11-07 ENCOUNTER — Other Ambulatory Visit: Payer: Self-pay | Admitting: Obstetrics and Gynecology

## 2016-11-07 ENCOUNTER — Other Ambulatory Visit (HOSPITAL_COMMUNITY)
Admission: RE | Admit: 2016-11-07 | Discharge: 2016-11-07 | Disposition: A | Discharge status: Home / Self Care | Payer: BLUE CROSS/BLUE SHIELD | Source: Ambulatory Visit | Attending: Obstetrics and Gynecology | Admitting: Obstetrics and Gynecology

## 2016-11-07 DIAGNOSIS — Z124 Encounter for screening for malignant neoplasm of cervix: Secondary | ICD-10-CM | POA: Diagnosis not present

## 2016-11-07 DIAGNOSIS — Z3481 Encounter for supervision of other normal pregnancy, first trimester: Secondary | ICD-10-CM | POA: Diagnosis not present

## 2016-11-08 DIAGNOSIS — Z3A10 10 weeks gestation of pregnancy: Secondary | ICD-10-CM | POA: Diagnosis not present

## 2016-11-08 DIAGNOSIS — O09521 Supervision of elderly multigravida, first trimester: Secondary | ICD-10-CM | POA: Diagnosis not present

## 2016-11-11 LAB — CYTOLOGY - PAP
Chlamydia: NEGATIVE
Diagnosis: NEGATIVE
HPV (WINDOPATH): NOT DETECTED
NEISSERIA GONORRHEA: NEGATIVE

## 2016-12-06 DIAGNOSIS — Z23 Encounter for immunization: Secondary | ICD-10-CM | POA: Diagnosis not present

## 2016-12-31 DIAGNOSIS — J069 Acute upper respiratory infection, unspecified: Secondary | ICD-10-CM | POA: Diagnosis not present

## 2017-01-02 DIAGNOSIS — Z36 Encounter for antenatal screening for chromosomal anomalies: Secondary | ICD-10-CM | POA: Diagnosis not present

## 2017-01-02 DIAGNOSIS — O09522 Supervision of elderly multigravida, second trimester: Secondary | ICD-10-CM | POA: Diagnosis not present

## 2017-01-02 DIAGNOSIS — Z3482 Encounter for supervision of other normal pregnancy, second trimester: Secondary | ICD-10-CM | POA: Diagnosis not present

## 2017-03-07 DIAGNOSIS — Z3482 Encounter for supervision of other normal pregnancy, second trimester: Secondary | ICD-10-CM | POA: Diagnosis not present

## 2017-03-07 DIAGNOSIS — Z3483 Encounter for supervision of other normal pregnancy, third trimester: Secondary | ICD-10-CM | POA: Diagnosis not present

## 2017-03-10 DIAGNOSIS — Z3483 Encounter for supervision of other normal pregnancy, third trimester: Secondary | ICD-10-CM | POA: Diagnosis not present

## 2017-03-21 DIAGNOSIS — Z23 Encounter for immunization: Secondary | ICD-10-CM | POA: Diagnosis not present

## 2017-03-21 DIAGNOSIS — O24410 Gestational diabetes mellitus in pregnancy, diet controlled: Secondary | ICD-10-CM | POA: Diagnosis not present

## 2017-04-04 DIAGNOSIS — O24415 Gestational diabetes mellitus in pregnancy, controlled by oral hypoglycemic drugs: Secondary | ICD-10-CM | POA: Diagnosis not present

## 2017-04-05 ENCOUNTER — Encounter: Payer: BLUE CROSS/BLUE SHIELD | Attending: Obstetrics and Gynecology | Admitting: Registered"

## 2017-04-05 ENCOUNTER — Encounter: Payer: Self-pay | Admitting: Registered"

## 2017-04-05 DIAGNOSIS — R7309 Other abnormal glucose: Secondary | ICD-10-CM | POA: Insufficient documentation

## 2017-04-05 DIAGNOSIS — Z3A00 Weeks of gestation of pregnancy not specified: Secondary | ICD-10-CM | POA: Diagnosis not present

## 2017-04-05 DIAGNOSIS — O99810 Abnormal glucose complicating pregnancy: Secondary | ICD-10-CM | POA: Insufficient documentation

## 2017-04-05 DIAGNOSIS — Z713 Dietary counseling and surveillance: Secondary | ICD-10-CM | POA: Diagnosis not present

## 2017-04-05 NOTE — Progress Notes (Signed)
Patient was seen on 04/05/17 for Gestational Diabetes self-management class at the [facility]. The following learning objectives were met by the patient during this course:   States the definition of Gestational Diabetes  States why dietary management is important in controlling blood glucose  Describes the effects each nutrient has on blood glucose levels  Demonstrates ability to create a balanced meal plan  Demonstrates carbohydrate counting   States when to check blood glucose levels  Demonstrates proper blood glucose monitoring techniques  States the effect of stress and exercise on blood glucose levels  States the importance of limiting caffeine and abstaining from alcohol and smoking  Blood glucose monitor given: none  Patient instructed to monitor glucose levels: FBS: 60 - <95; 1 hour: <140; 2 hour: <120  Patient received handouts:  Nutrition Diabetes and Pregnancy, including carb counting list  Patient will be seen for follow-up as needed.

## 2017-04-07 DIAGNOSIS — O24415 Gestational diabetes mellitus in pregnancy, controlled by oral hypoglycemic drugs: Secondary | ICD-10-CM | POA: Diagnosis not present

## 2017-04-11 DIAGNOSIS — O24415 Gestational diabetes mellitus in pregnancy, controlled by oral hypoglycemic drugs: Secondary | ICD-10-CM | POA: Diagnosis not present

## 2017-04-14 DIAGNOSIS — O24415 Gestational diabetes mellitus in pregnancy, controlled by oral hypoglycemic drugs: Secondary | ICD-10-CM | POA: Diagnosis not present

## 2017-04-18 DIAGNOSIS — O24415 Gestational diabetes mellitus in pregnancy, controlled by oral hypoglycemic drugs: Secondary | ICD-10-CM | POA: Diagnosis not present

## 2017-04-19 ENCOUNTER — Encounter (HOSPITAL_COMMUNITY): Payer: Self-pay | Admitting: *Deleted

## 2017-04-20 ENCOUNTER — Encounter (HOSPITAL_COMMUNITY): Payer: Self-pay

## 2017-04-21 DIAGNOSIS — O24415 Gestational diabetes mellitus in pregnancy, controlled by oral hypoglycemic drugs: Secondary | ICD-10-CM | POA: Diagnosis not present

## 2017-04-25 ENCOUNTER — Other Ambulatory Visit: Payer: Self-pay | Admitting: Obstetrics and Gynecology

## 2017-04-25 DIAGNOSIS — O24415 Gestational diabetes mellitus in pregnancy, controlled by oral hypoglycemic drugs: Secondary | ICD-10-CM | POA: Diagnosis not present

## 2017-04-25 MED ORDER — BETAMETHASONE SOD PHOS & ACET 6 (3-3) MG/ML IJ SUSP: 12.0000 mg | INTRAMUSCULAR | Status: AC

## 2017-04-27 ENCOUNTER — Inpatient Hospital Stay (HOSPITAL_COMMUNITY)
Admission: AD | Admit: 2017-04-27 | Discharge: 2017-04-27 | Disposition: A | Discharge status: Home / Self Care | Payer: BLUE CROSS/BLUE SHIELD | Source: Ambulatory Visit | Attending: Obstetrics & Gynecology | Admitting: Obstetrics & Gynecology

## 2017-04-27 DIAGNOSIS — Z3A36 36 weeks gestation of pregnancy: Secondary | ICD-10-CM | POA: Diagnosis not present

## 2017-04-27 DIAGNOSIS — O163 Unspecified maternal hypertension, third trimester: Secondary | ICD-10-CM | POA: Diagnosis not present

## 2017-04-27 MED ORDER — BETAMETHASONE SOD PHOS & ACET 6 (3-3) MG/ML IJ SUSP
12.0000 mg | Freq: Once | INTRAMUSCULAR | Status: AC
  Administered 2017-04-27: 12 mg via INTRAMUSCULAR
  Filled 2017-04-27: qty 2

## 2017-04-27 NOTE — MAU Note (Signed)
Pt is scheduled for c/s at 36wks. Will receive betamethasone injection today and tomorrow.

## 2017-04-28 ENCOUNTER — Inpatient Hospital Stay (HOSPITAL_COMMUNITY)
Admission: AD | Admit: 2017-04-28 | Discharge: 2017-04-28 | Disposition: A | Discharge status: Home / Self Care | Payer: BLUE CROSS/BLUE SHIELD | Source: Ambulatory Visit | Attending: Obstetrics and Gynecology | Admitting: Obstetrics and Gynecology

## 2017-04-28 ENCOUNTER — Other Ambulatory Visit: Payer: Self-pay

## 2017-04-28 ENCOUNTER — Encounter (HOSPITAL_COMMUNITY): Payer: Self-pay | Admitting: *Deleted

## 2017-04-28 DIAGNOSIS — O09899 Supervision of other high risk pregnancies, unspecified trimester: Secondary | ICD-10-CM | POA: Diagnosis not present

## 2017-04-28 DIAGNOSIS — O24415 Gestational diabetes mellitus in pregnancy, controlled by oral hypoglycemic drugs: Secondary | ICD-10-CM | POA: Diagnosis not present

## 2017-04-28 DIAGNOSIS — O09219 Supervision of pregnancy with history of pre-term labor, unspecified trimester: Secondary | ICD-10-CM | POA: Diagnosis not present

## 2017-04-28 DIAGNOSIS — Z3A00 Weeks of gestation of pregnancy not specified: Secondary | ICD-10-CM | POA: Insufficient documentation

## 2017-04-28 DIAGNOSIS — O163 Unspecified maternal hypertension, third trimester: Secondary | ICD-10-CM

## 2017-04-28 LAB — COMPREHENSIVE METABOLIC PANEL
ALBUMIN: 3 g/dL — AB (ref 3.5–5.0)
ALT: 10 U/L — ABNORMAL LOW (ref 14–54)
ANION GAP: 10 (ref 5–15)
AST: 20 U/L (ref 15–41)
Alkaline Phosphatase: 126 U/L (ref 38–126)
BUN: 20 mg/dL (ref 6–20)
CO2: 19 mmol/L — AB (ref 22–32)
Calcium: 9 mg/dL (ref 8.9–10.3)
Chloride: 106 mmol/L (ref 101–111)
Creatinine, Ser: 0.61 mg/dL (ref 0.44–1.00)
GFR calc Af Amer: >60 mL/min (ref >60)
GFR calc non Af Amer: >60 mL/min (ref >60)
GLUCOSE: 127 mg/dL — AB (ref 65–99)
Potassium: 4.3 mmol/L (ref 3.5–5.1)
SODIUM: 135 mmol/L (ref 135–145)
Total Bilirubin: 0.5 mg/dL (ref 0.3–1.2)
Total Protein: 6.1 g/dL — ABNORMAL LOW (ref 6.5–8.1)

## 2017-04-28 LAB — URINALYSIS, ROUTINE W REFLEX MICROSCOPIC
Bilirubin Urine: NEGATIVE
Glucose, UA: NEGATIVE mg/dL
HGB URINE DIPSTICK: NEGATIVE
Ketones, ur: 20 mg/dL — AB
Leukocytes, UA: NEGATIVE
NITRITE: NEGATIVE
PROTEIN: NEGATIVE mg/dL
Specific Gravity, Urine: 1.03 (ref 1.005–1.030)
pH: 5 (ref 5.0–8.0)

## 2017-04-28 LAB — CBC
HCT: 34.9 % — ABNORMAL LOW (ref 36.0–46.0)
HEMOGLOBIN: 11.5 g/dL — AB (ref 12.0–15.0)
MCH: 26.3 pg (ref 26.0–34.0)
MCHC: 33 g/dL (ref 30.0–36.0)
MCV: 79.9 fL (ref 78.0–100.0)
Platelets: 234 10*3/uL (ref 150–400)
RBC: 4.37 MIL/uL (ref 3.87–5.11)
RDW: 14.9 % (ref 11.5–15.5)
WBC: 10.3 10*3/uL (ref 4.0–10.5)

## 2017-04-28 LAB — PROTEIN / CREATININE RATIO, URINE
Creatinine, Urine: 167 mg/dL
PROTEIN CREATININE RATIO: 0.1 mg/mg{creat} (ref 0.00–0.15)
Total Protein, Urine: 16 mg/dL

## 2017-04-28 MED ORDER — BETAMETHASONE SOD PHOS & ACET 6 (3-3) MG/ML IJ SUSP
12.0000 mg | Freq: Once | INTRAMUSCULAR | Status: AC
  Administered 2017-04-28: 12 mg via INTRAMUSCULAR
  Filled 2017-04-28: qty 2

## 2017-04-28 NOTE — Discharge Instructions (Signed)
Hypertension During Pregnancy Hypertension is also called high blood pressure. High blood pressure means that the force of your blood moving in your body is too strong. When you are pregnant, this condition should be watched carefully. It can cause problems for you and your baby. Follow these instructions at home: Eating and drinking  Drink enough fluid to keep your pee (urine) clear or pale yellow.  Eat healthy foods that are low in salt (sodium). ? Do not add salt to your food. ? Check labels on foods and drinks to see much salt is in them. Look on the label where you see "Sodium." Lifestyle  Do not use any products that contain nicotine or tobacco, such as cigarettes and e-cigarettes. If you need help quitting, ask your doctor.  Do not use alcohol.  Avoid caffeine.  Avoid stress. Rest and get plenty of sleep. General instructions  Take over-the-counter and prescription medicines only as told by your doctor.  While lying down, lie on your left side. This keeps pressure off your baby.  While sitting or lying down, raise (elevate) your feet. Try putting some pillows under your lower legs.  Exercise regularly. Ask your doctor what kinds of exercise are best for you.  Keep all prenatal and follow-up visits as told by your doctor. This is important. Contact a doctor if:  You have symptoms that your doctor told you to watch for, such as: ? Fever. ? Throwing up (vomiting). ? Headache. Get help right away if:  You have very bad pain in your belly (abdomen).  You are throwing up, and this does not get better with treatment.  You suddenly get swelling in your hands, ankles, or face.  You gain 4 lb (1.8 kg) or more in 1 week.  You get bleeding from your vagina.  You have blood in your pee.  You do not feel your baby moving as much as normal.  You have a change in vision.  You have muscle twitching or sudden tightening (spasms).  You have trouble breathing.  Your  lips or fingernails turn blue. This information is not intended to replace advice given to you by your health care provider. Make sure you discuss any questions you have with your health care provider. Document Released: 02/26/2010 Document Revised: 10/06/2015 Document Reviewed: 10/06/2015 Elsevier Interactive Patient Education  2018 [employer]. Hypertension During Pregnancy Hypertension is also called high blood pressure. High blood pressure means that the force of your blood moving in your body is too strong. When you are pregnant, this condition should be watched carefully. It can cause problems for you and your baby. Follow these instructions at home: Eating and drinking  Drink enough fluid to keep your pee (urine) clear or pale yellow.  Eat healthy foods that are low in salt (sodium). ? Do not add salt to your food. ? Check labels on foods and drinks to see much salt is in them. Look on the label where you see "Sodium." Lifestyle  Do not use any products that contain nicotine or tobacco, such as cigarettes and e-cigarettes. If you need help quitting, ask your doctor.  Do not use alcohol.  Avoid caffeine.  Avoid stress. Rest and get plenty of sleep. General instructions  Take over-the-counter and prescription medicines only as told by your doctor.  While lying down, lie on your left side. This keeps pressure off your baby.  While sitting or lying down, raise (elevate) your feet. Try putting some pillows under your lower legs.  Exercise regularly. Ask your doctor what kinds of exercise are best for you.  Keep all prenatal and follow-up visits as told by your doctor. This is important. Contact a doctor if:  You have symptoms that your doctor told you to watch for, such as: ? Fever. ? Throwing up (vomiting). ? Headache. Get help right away if:  You have very bad pain in your belly (abdomen).  You are throwing up, and this does not get better with treatment.  You  suddenly get swelling in your hands, ankles, or face.  You gain 4 lb (1.8 kg) or more in 1 week.  You get bleeding from your vagina.  You have blood in your pee.  You do not feel your baby moving as much as normal.  You have a change in vision.  You have muscle twitching or sudden tightening (spasms).  You have trouble breathing.  Your lips or fingernails turn blue. This information is not intended to replace advice given to you by your health care provider. Make sure you discuss any questions you have with your health care provider. Document Released: 02/26/2010 Document Revised: 10/06/2015 Document Reviewed: 10/06/2015 Elsevier Interactive Patient Education  [employer].

## 2017-04-28 NOTE — MAU Note (Signed)
Pt presents for BMZ injection.  Denies any issues/problems.

## 2017-05-01 ENCOUNTER — Other Ambulatory Visit: Payer: Self-pay | Admitting: Obstetrics and Gynecology

## 2017-05-01 NOTE — Patient Instructions (Signed)
Archana Eckman  05/01/2017   Your procedure is scheduled on:  05/03/2017  Enter through the Main Entrance of [facility] [place] up the phone at the desk and dial [phone]  Call this number if you have problems the morning of surgery:[phone]  Remember:   Do not eat food:(After Midnight) Desps de medianoche.  Do not drink clear liquids: (After Midnight) Desps de medianoche.  Take these medicines the morning of surgery with A SIP OF WATER: none.  DO NOT TAKE YOUR GLYBURIDE THE NIGHT BEFORE SURGERY.   Do not wear jewelry, make-up or nail polish.  Do not wear lotions, powders, or perfumes. Do not wear deodorant.  Do not shave 48 hours prior to surgery.  Do not bring valuables to the hospital.  [facility] is not   responsible for any belongings or valuables brought to the hospital.  Contacts, dentures or bridgework may not be worn into surgery.  Leave suitcase in the car. After surgery it may be brought to your room.  For patients admitted to the hospital, checkout time is 11:00 AM the day of              discharge.    N/A   Please read over the following fact sheets that you were given:   Surgical Site Infection Prevention

## 2017-05-02 ENCOUNTER — Encounter (HOSPITAL_COMMUNITY)
Admission: RE | Admit: 2017-05-02 | Discharge: 2017-05-02 | Disposition: A | Discharge status: Home / Self Care | Payer: BLUE CROSS/BLUE SHIELD | Source: Ambulatory Visit | Attending: Obstetrics and Gynecology | Admitting: Obstetrics and Gynecology

## 2017-05-02 DIAGNOSIS — Z3A00 Weeks of gestation of pregnancy not specified: Secondary | ICD-10-CM | POA: Diagnosis not present

## 2017-05-02 DIAGNOSIS — Z23 Encounter for immunization: Secondary | ICD-10-CM | POA: Diagnosis not present

## 2017-05-02 DIAGNOSIS — O24429 Gestational diabetes mellitus in childbirth, unspecified control: Secondary | ICD-10-CM | POA: Diagnosis not present

## 2017-05-02 DIAGNOSIS — O24415 Gestational diabetes mellitus in pregnancy, controlled by oral hypoglycemic drugs: Secondary | ICD-10-CM | POA: Diagnosis not present

## 2017-05-02 DIAGNOSIS — Z3483 Encounter for supervision of other normal pregnancy, third trimester: Secondary | ICD-10-CM | POA: Diagnosis not present

## 2017-05-02 DIAGNOSIS — Z98891 History of uterine scar from previous surgery: Secondary | ICD-10-CM | POA: Diagnosis not present

## 2017-05-02 DIAGNOSIS — O24425 Gestational diabetes mellitus in childbirth, controlled by oral hypoglycemic drugs: Secondary | ICD-10-CM | POA: Diagnosis not present

## 2017-05-02 DIAGNOSIS — O34212 Maternal care for vertical scar from previous cesarean delivery: Secondary | ICD-10-CM | POA: Diagnosis not present

## 2017-05-02 DIAGNOSIS — O34211 Maternal care for low transverse scar from previous cesarean delivery: Secondary | ICD-10-CM | POA: Diagnosis not present

## 2017-05-02 DIAGNOSIS — Z3A36 36 weeks gestation of pregnancy: Secondary | ICD-10-CM | POA: Diagnosis not present

## 2017-05-02 DIAGNOSIS — O99214 Obesity complicating childbirth: Secondary | ICD-10-CM | POA: Diagnosis not present

## 2017-05-02 DIAGNOSIS — Z87891 Personal history of nicotine dependence: Secondary | ICD-10-CM | POA: Diagnosis not present

## 2017-05-02 HISTORY — DX: Gestational diabetes mellitus in pregnancy, unspecified control: O24.419

## 2017-05-02 LAB — CBC
HCT: 36.8 % (ref 36.0–46.0)
HEMOGLOBIN: 12 g/dL (ref 12.0–15.0)
MCH: 26.2 pg (ref 26.0–34.0)
MCHC: 32.6 g/dL (ref 30.0–36.0)
MCV: 80.3 fL (ref 78.0–100.0)
Platelets: 245 10*3/uL (ref 150–400)
RBC: 4.58 MIL/uL (ref 3.87–5.11)
RDW: 15.2 % (ref 11.5–15.5)
WBC: 7.7 10*3/uL (ref 4.0–10.5)

## 2017-05-03 ENCOUNTER — Inpatient Hospital Stay (HOSPITAL_COMMUNITY): Payer: BLUE CROSS/BLUE SHIELD | Admitting: Anesthesiology

## 2017-05-03 ENCOUNTER — Encounter (HOSPITAL_COMMUNITY): Payer: Self-pay

## 2017-05-03 ENCOUNTER — Encounter (HOSPITAL_COMMUNITY)
Admission: AD | Disposition: A | Discharge status: Home / Self Care | Payer: Self-pay | Source: Ambulatory Visit | Attending: Obstetrics and Gynecology

## 2017-05-03 ENCOUNTER — Other Ambulatory Visit: Payer: Self-pay | Admitting: Obstetrics and Gynecology

## 2017-05-03 ENCOUNTER — Inpatient Hospital Stay (HOSPITAL_COMMUNITY)
Admission: AD | Admit: 2017-05-03 | Discharge: 2017-05-06 | DRG: 786 | Disposition: A | Discharge status: Home / Self Care | Payer: BLUE CROSS/BLUE SHIELD | Source: Ambulatory Visit | Attending: Obstetrics and Gynecology | Admitting: Obstetrics and Gynecology

## 2017-05-03 ENCOUNTER — Other Ambulatory Visit: Payer: Self-pay

## 2017-05-03 DIAGNOSIS — Z3A36 36 weeks gestation of pregnancy: Secondary | ICD-10-CM | POA: Diagnosis not present

## 2017-05-03 DIAGNOSIS — Z87891 Personal history of nicotine dependence: Secondary | ICD-10-CM

## 2017-05-03 DIAGNOSIS — O34211 Maternal care for low transverse scar from previous cesarean delivery: Secondary | ICD-10-CM | POA: Diagnosis not present

## 2017-05-03 DIAGNOSIS — O24425 Gestational diabetes mellitus in childbirth, controlled by oral hypoglycemic drugs: Secondary | ICD-10-CM | POA: Diagnosis present

## 2017-05-03 DIAGNOSIS — O99214 Obesity complicating childbirth: Secondary | ICD-10-CM | POA: Diagnosis present

## 2017-05-03 DIAGNOSIS — Z3A00 Weeks of gestation of pregnancy not specified: Secondary | ICD-10-CM | POA: Diagnosis not present

## 2017-05-03 DIAGNOSIS — O24429 Gestational diabetes mellitus in childbirth, unspecified control: Secondary | ICD-10-CM | POA: Diagnosis not present

## 2017-05-03 DIAGNOSIS — Z98891 History of uterine scar from previous surgery: Secondary | ICD-10-CM | POA: Diagnosis not present

## 2017-05-03 DIAGNOSIS — O34212 Maternal care for vertical scar from previous cesarean delivery: Secondary | ICD-10-CM | POA: Diagnosis present

## 2017-05-03 LAB — RPR: RPR: NONREACTIVE

## 2017-05-03 LAB — GLUCOSE, CAPILLARY
GLUCOSE-CAPILLARY: 76 mg/dL (ref 65–99)
Glucose-Capillary: 100 mg/dL — ABNORMAL HIGH (ref 65–99)

## 2017-05-03 SURGERY — Surgical Case
Anesthesia: Spinal

## 2017-05-03 MED ORDER — LACTATED RINGERS IV SOLN
INTRAVENOUS | Status: DC | PRN
  Administered 2017-05-03: 14:00:00 via INTRAVENOUS

## 2017-05-03 MED ORDER — EPHEDRINE 5 MG/ML INJ
INTRAVENOUS | Status: AC
  Filled 2017-05-03: qty 10

## 2017-05-03 MED ORDER — OXYCODONE HCL 5 MG PO TABS: 5.0000 mg | ORAL_TABLET | ORAL | Status: DC | PRN

## 2017-05-03 MED ORDER — LACTATED RINGERS IV SOLN
INTRAVENOUS | Status: DC
  Administered 2017-05-03 (×2): via INTRAVENOUS

## 2017-05-03 MED ORDER — METHYLERGONOVINE MALEATE 0.2 MG/ML IJ SOLN: 0.2000 mg | INTRAMUSCULAR | Status: DC | PRN

## 2017-05-03 MED ORDER — EPHEDRINE SULFATE 50 MG/ML IJ SOLN
INTRAMUSCULAR | Status: DC | PRN
  Administered 2017-05-03 (×2): 10 mg via INTRAVENOUS

## 2017-05-03 MED ORDER — DIBUCAINE 1 % RE OINT: 1.0000 "application " | TOPICAL_OINTMENT | RECTAL | Status: DC | PRN

## 2017-05-03 MED ORDER — OXYTOCIN 10 UNIT/ML IJ SOLN
INTRAMUSCULAR | Status: AC
  Filled 2017-05-03: qty 4

## 2017-05-03 MED ORDER — SIMETHICONE 80 MG PO CHEW
80.0000 mg | CHEWABLE_TABLET | ORAL | Status: DC
  Administered 2017-05-03 – 2017-05-05 (×3): 80 mg via ORAL
  Filled 2017-05-03 (×3): qty 1

## 2017-05-03 MED ORDER — WITCH HAZEL-GLYCERIN EX PADS: 1.0000 "application " | MEDICATED_PAD | CUTANEOUS | Status: DC | PRN

## 2017-05-03 MED ORDER — IBUPROFEN 600 MG PO TABS
600.0000 mg | ORAL_TABLET | Freq: Four times a day (QID) | ORAL | Status: DC
  Administered 2017-05-03 – 2017-05-06 (×10): 600 mg via ORAL
  Filled 2017-05-03 (×10): qty 1

## 2017-05-03 MED ORDER — NALBUPHINE HCL 10 MG/ML IJ SOLN: 5.0000 mg | INTRAMUSCULAR | Status: DC | PRN

## 2017-05-03 MED ORDER — SOD CITRATE-CITRIC ACID 500-334 MG/5ML PO SOLN: 30.0000 mL | ORAL | Status: DC

## 2017-05-03 MED ORDER — SCOPOLAMINE 1 MG/3DAYS TD PT72
MEDICATED_PATCH | TRANSDERMAL | Status: AC
  Filled 2017-05-03: qty 1

## 2017-05-03 MED ORDER — BUPIVACAINE IN DEXTROSE 0.75-8.25 % IT SOLN
INTRATHECAL | Status: DC | PRN
  Administered 2017-05-03: 1.5 mL via INTRATHECAL

## 2017-05-03 MED ORDER — OXYCODONE HCL 5 MG PO TABS: 10.0000 mg | ORAL_TABLET | ORAL | Status: DC | PRN

## 2017-05-03 MED ORDER — DIPHENHYDRAMINE HCL 25 MG PO CAPS: 25.0000 mg | ORAL_CAPSULE | Freq: Four times a day (QID) | ORAL | Status: DC | PRN

## 2017-05-03 MED ORDER — DIPHENHYDRAMINE HCL 25 MG PO CAPS: 25.0000 mg | ORAL_CAPSULE | ORAL | Status: DC | PRN

## 2017-05-03 MED ORDER — ACETAMINOPHEN 325 MG PO TABS: 650.0000 mg | ORAL_TABLET | ORAL | Status: DC | PRN

## 2017-05-03 MED ORDER — MENTHOL 3 MG MT LOZG: 1.0000 | LOZENGE | OROMUCOSAL | Status: DC | PRN

## 2017-05-03 MED ORDER — PHENYLEPHRINE HCL 10 MG/ML IJ SOLN
INTRAMUSCULAR | Status: DC | PRN
  Administered 2017-05-03 (×2): 80 ug via INTRAVENOUS

## 2017-05-03 MED ORDER — SCOPOLAMINE 1 MG/3DAYS TD PT72: 1.0000 | MEDICATED_PATCH | Freq: Once | TRANSDERMAL | Status: DC

## 2017-05-03 MED ORDER — OXYTOCIN 10 UNIT/ML IJ SOLN
INTRAVENOUS | Status: DC | PRN
  Administered 2017-05-03: 40 [IU] via INTRAVENOUS

## 2017-05-03 MED ORDER — ONDANSETRON HCL 4 MG/2ML IJ SOLN
INTRAMUSCULAR | Status: DC | PRN
  Administered 2017-05-03: 4 mg via INTRAVENOUS

## 2017-05-03 MED ORDER — SODIUM CHLORIDE 0.9% FLUSH: 3.0000 mL | INTRAVENOUS | Status: DC | PRN

## 2017-05-03 MED ORDER — DIPHENHYDRAMINE HCL 50 MG/ML IJ SOLN: 12.5000 mg | INTRAMUSCULAR | Status: DC | PRN

## 2017-05-03 MED ORDER — NALBUPHINE HCL 10 MG/ML IJ SOLN: 5.0000 mg | Freq: Once | INTRAMUSCULAR | Status: DC | PRN

## 2017-05-03 MED ORDER — PROMETHAZINE HCL 25 MG/ML IJ SOLN: 6.2500 mg | INTRAMUSCULAR | Status: DC | PRN

## 2017-05-03 MED ORDER — COCONUT OIL OIL: 1.0000 "application " | TOPICAL_OIL | Status: DC | PRN

## 2017-05-03 MED ORDER — PHENYLEPHRINE 8 MG IN D5W 100 ML (0.08MG/ML) PREMIX OPTIME
INJECTION | INTRAVENOUS | Status: DC | PRN
  Administered 2017-05-03: 60 ug/min via INTRAVENOUS

## 2017-05-03 MED ORDER — FERROUS SULFATE 325 (65 FE) MG PO TABS
325.0000 mg | ORAL_TABLET | Freq: Two times a day (BID) | ORAL | Status: DC
  Administered 2017-05-04 (×2): 325 mg via ORAL
  Filled 2017-05-03 (×4): qty 1

## 2017-05-03 MED ORDER — SENNOSIDES-DOCUSATE SODIUM 8.6-50 MG PO TABS
2.0000 | ORAL_TABLET | ORAL | Status: DC
  Administered 2017-05-03 – 2017-05-05 (×3): 2 via ORAL
  Filled 2017-05-03 (×3): qty 2

## 2017-05-03 MED ORDER — MORPHINE SULFATE (PF) 0.5 MG/ML IJ SOLN
INTRAMUSCULAR | Status: DC | PRN
  Administered 2017-05-03: .2 mg via EPIDURAL

## 2017-05-03 MED ORDER — DEXTROSE 50 % IV SOLN
12.5000 g | Freq: Once | INTRAVENOUS | Status: AC
  Administered 2017-05-03: 12.5 g via INTRAVENOUS
  Filled 2017-05-03: qty 50

## 2017-05-03 MED ORDER — NALOXONE HCL 4 MG/10ML IJ SOLN: 1.0000 ug/kg/h | INTRAVENOUS | Status: DC | PRN

## 2017-05-03 MED ORDER — SIMETHICONE 80 MG PO CHEW: 80.0000 mg | CHEWABLE_TABLET | ORAL | Status: DC | PRN

## 2017-05-03 MED ORDER — HYDROMORPHONE HCL 1 MG/ML IJ SOLN: 0.2500 mg | INTRAMUSCULAR | Status: DC | PRN

## 2017-05-03 MED ORDER — ONDANSETRON HCL 4 MG/2ML IJ SOLN: 4.0000 mg | Freq: Three times a day (TID) | INTRAMUSCULAR | Status: DC | PRN

## 2017-05-03 MED ORDER — PHENYLEPHRINE 8 MG IN D5W 100 ML (0.08MG/ML) PREMIX OPTIME
INJECTION | INTRAVENOUS | Status: AC
  Filled 2017-05-03: qty 100

## 2017-05-03 MED ORDER — KETOROLAC TROMETHAMINE 30 MG/ML IJ SOLN: 30.0000 mg | Freq: Once | INTRAMUSCULAR | Status: DC | PRN

## 2017-05-03 MED ORDER — MORPHINE SULFATE (PF) 0.5 MG/ML IJ SOLN
INTRAMUSCULAR | Status: AC
  Filled 2017-05-03: qty 10

## 2017-05-03 MED ORDER — ONDANSETRON HCL 4 MG/2ML IJ SOLN
INTRAMUSCULAR | Status: AC
  Filled 2017-05-03: qty 2

## 2017-05-03 MED ORDER — LACTATED RINGERS IV SOLN: INTRAVENOUS | Status: DC

## 2017-05-03 MED ORDER — CEFAZOLIN SODIUM-DEXTROSE 2-4 GM/100ML-% IV SOLN
2.0000 g | INTRAVENOUS | Status: AC
  Administered 2017-05-03: 2 g via INTRAVENOUS

## 2017-05-03 MED ORDER — SODIUM CHLORIDE 0.9 % IR SOLN
Status: DC | PRN
  Administered 2017-05-03: 1

## 2017-05-03 MED ORDER — NALOXONE HCL 0.4 MG/ML IJ SOLN: 0.4000 mg | INTRAMUSCULAR | Status: DC | PRN

## 2017-05-03 MED ORDER — SIMETHICONE 80 MG PO CHEW
80.0000 mg | CHEWABLE_TABLET | Freq: Three times a day (TID) | ORAL | Status: DC
  Administered 2017-05-04 – 2017-05-06 (×7): 80 mg via ORAL
  Filled 2017-05-03 (×7): qty 1

## 2017-05-03 MED ORDER — ZOLPIDEM TARTRATE 5 MG PO TABS: 5.0000 mg | ORAL_TABLET | Freq: Every evening | ORAL | Status: DC | PRN

## 2017-05-03 MED ORDER — METHYLERGONOVINE MALEATE 0.2 MG PO TABS: 0.2000 mg | ORAL_TABLET | ORAL | Status: DC | PRN

## 2017-05-03 MED ORDER — OXYTOCIN 40 UNITS IN LACTATED RINGERS INFUSION - SIMPLE MED: 2.5000 [IU]/h | INTRAVENOUS | Status: AC

## 2017-05-03 MED ORDER — STERILE WATER FOR IRRIGATION IR SOLN
Status: DC | PRN
  Administered 2017-05-03: 1

## 2017-05-03 MED ORDER — MEPERIDINE HCL 25 MG/ML IJ SOLN: 6.2500 mg | INTRAMUSCULAR | Status: DC | PRN

## 2017-05-03 MED ORDER — PRENATAL MULTIVITAMIN CH
1.0000 | ORAL_TABLET | Freq: Every day | ORAL | Status: DC
  Administered 2017-05-04 – 2017-05-05 (×2): 1 via ORAL
  Filled 2017-05-03 (×2): qty 1

## 2017-05-03 SURGICAL SUPPLY — 40 items
BARRIER ADHS 3X4 INTERCEED (GAUZE/BANDAGES/DRESSINGS) ×2 IMPLANT
BENZOIN TINCTURE PRP APPL 2/3 (GAUZE/BANDAGES/DRESSINGS) ×2 IMPLANT
CHLORAPREP W/TINT 26ML (MISCELLANEOUS) ×2 IMPLANT
CLAMP CORD UMBIL (MISCELLANEOUS) IMPLANT
CLOTH BEACON ORANGE TIMEOUT ST (SAFETY) ×2 IMPLANT
CLSR STERI-STRIP ANTIMIC 1/2X4 (GAUZE/BANDAGES/DRESSINGS) ×2 IMPLANT
DRSG OPSITE POSTOP 4X10 (GAUZE/BANDAGES/DRESSINGS) ×2 IMPLANT
ELECT REM PT RETURN 9FT ADLT (ELECTROSURGICAL) ×2
ELECTRODE REM PT RTRN 9FT ADLT (ELECTROSURGICAL) ×1 IMPLANT
EXTRACTOR VACUUM KIWI (MISCELLANEOUS) IMPLANT
GAUZE SPONGE 4X4 12PLY STRL LF (GAUZE/BANDAGES/DRESSINGS) ×2 IMPLANT
GLOVE BIOGEL M 6.5 STRL (GLOVE) ×4 IMPLANT
GLOVE BIOGEL PI IND STRL 6.5 (GLOVE) ×1 IMPLANT
GLOVE BIOGEL PI IND STRL 7.0 (GLOVE) ×1 IMPLANT
GLOVE BIOGEL PI INDICATOR 6.5 (GLOVE) ×1
GLOVE BIOGEL PI INDICATOR 7.0 (GLOVE) ×1
GOWN STRL REUS W/TWL LRG LVL3 (GOWN DISPOSABLE) ×6 IMPLANT
KIT ABG SYR 3ML LUER SLIP (SYRINGE) IMPLANT
NEEDLE HYPO 25X5/8 SAFETYGLIDE (NEEDLE) IMPLANT
NS IRRIG 1000ML POUR BTL (IV SOLUTION) ×2 IMPLANT
PACK C SECTION WH (CUSTOM PROCEDURE TRAY) ×2 IMPLANT
PAD ABD 7.5X8 STRL (GAUZE/BANDAGES/DRESSINGS) ×2 IMPLANT
PAD OB MATERNITY 4.3X12.25 (PERSONAL CARE ITEMS) ×2 IMPLANT
PENCIL SMOKE EVAC W/HOLSTER (ELECTROSURGICAL) ×2 IMPLANT
RETRACTOR TRAXI PANNICULUS (MISCELLANEOUS) ×1 IMPLANT
RTRCTR C-SECT PINK 25CM LRG (MISCELLANEOUS) ×2 IMPLANT
STRIP CLOSURE SKIN 1/2X4 (GAUZE/BANDAGES/DRESSINGS) ×2 IMPLANT
SUT PDS AB 0 CT1 27 (SUTURE) ×4 IMPLANT
SUT PLAIN 0 NONE (SUTURE) IMPLANT
SUT VIC AB 0 CTX 36 (SUTURE) ×3
SUT VIC AB 0 CTX36XBRD ANBCTRL (SUTURE) ×3 IMPLANT
SUT VIC AB 2-0 CT1 27 (SUTURE) ×1
SUT VIC AB 2-0 CT1 TAPERPNT 27 (SUTURE) ×1 IMPLANT
SUT VIC AB 3-0 SH 27 (SUTURE)
SUT VIC AB 3-0 SH 27X BRD (SUTURE) IMPLANT
SUT VIC AB 4-0 KS 27 (SUTURE) ×2 IMPLANT
TAPE CLOTH SURG 6X10 WHT LF (GAUZE/BANDAGES/DRESSINGS) ×2 IMPLANT
TOWEL OR 17X24 6PK STRL BLUE (TOWEL DISPOSABLE) ×2 IMPLANT
TRAXI PANNICULUS RETRACTOR (MISCELLANEOUS) ×1
TRAY FOLEY BAG SILVER LF 14FR (SET/KITS/TRAYS/PACK) ×2 IMPLANT

## 2017-05-03 NOTE — Addendum Note (Signed)
Addendum  created 05/03/17 1812 by Asher Muir, CRNA   Sign clinical note

## 2017-05-03 NOTE — H&P (Signed)
Emily Moss is a 37 y.o. female presenting for repeat cesarean section at 36 wks and 0 days due to h/o classical cesarean section with myomectomy at the time of cesarean section. Pregnancy is also complicated by gestational diabetes. Controlled with glyburide 5 mg daily.  Pt received BMZ for fetal lung maturity 3/21 and 3/22. Prenatal care provided by Dr. Christophe Louis with [facility].    OB History    Gravida  2   Para  1   Term  1   Preterm      AB      Living  1     SAB      TAB      Ectopic      Multiple      Live Births  1          Ultrasound on 05/02/2017 EFW 5 lbs and 9 oz .Marland Kitchen Vertex presentation/ placenta is anterior.    Past Medical History:  Diagnosis Date  . Abnormal Pap smear 2009   LAST PAP 05/2011  . Asthma    CHILDHOOD  . Diabetes mellitus    gestational- diet controlled  . Eczema   . Fibroid 2010  . Gestational diabetes   . H/O varicella   . Headache(784.0)    one severe headache during pregnancy   . Heart murmur    AT BIRTH RESOLVED  . Heartburn in pregnancy   . Infection    UTI X 1  . Infection 2000, [id number]   CHLAMYDIA  . Recurrent upper respiratory infection (URI)    RECURRENT BRONCHITIS   Past Surgical History:  Procedure Laterality Date  . CESAREAN SECTION  01/20/2012   Procedure: CESAREAN SECTION;  Surgeon: Eldred Manges, MD;  Location: [facility];  Service: Obstetrics;  Laterality: N/A;  . NO PAST SURGERIES    . WISDOM TOOTH EXTRACTION  2011   x 2   Family History: family history includes Alcohol abuse in her maternal grandfather and maternal uncle; Alzheimer's disease in her maternal grandfather; Asthma in her other; Cancer in her cousin and cousin; Cancer (age of onset: 37) in her mother; Diabetes in her mother; Drug abuse in her brother, cousin, and sister; Hypertension in her mother; Lupus in her paternal grandmother; Stroke in her cousin. Social History:  reports that she quit smoking about 6 years ago. Her smoking use  included pipe and cigars. She has never used smokeless tobacco. She reports that she does not drink alcohol or use drugs.     Maternal Diabetes: Yes:  Diabetes Type:  Insulin/Medication controlled Genetic Screening: Normal Maternal Ultrasounds/Referrals: Normal Fetal Ultrasounds or other Referrals:  None Maternal Substance Abuse:  No Significant Maternal Medications:  Meds include: Other:  Significant Maternal Lab Results:  None Other Comments:  None  Review of Systems  Constitutional: Negative.   HENT: Negative.   Eyes: Negative.   Respiratory: Negative.   Cardiovascular: Negative.   Gastrointestinal: Negative.   Genitourinary: Negative.   Musculoskeletal: Negative.   Skin: Negative.   Neurological: Negative.   Endo/Heme/Allergies: Negative.   Psychiatric/Behavioral: Negative.    History   Last menstrual period 08/24/2016, currently breastfeeding. Exam Physical Exam  Vitals reviewed. Constitutional: She is oriented to person, place, and time. She appears well-developed and well-nourished.  HENT:  Head: Normocephalic and atraumatic.  Eyes: Pupils are equal, round, and reactive to light. Conjunctivae are normal.  Neck: Normal range of motion. Neck supple.  Cardiovascular: Normal rate and regular rhythm.  Respiratory: Effort normal and  breath sounds normal.  GI: There is no tenderness.  Genitourinary: Vagina normal.  Genitourinary Comments: Cervix is closed thick and high   Musculoskeletal: Normal range of motion. She exhibits edema.  Neurological: She is alert and oriented to person, place, and time. She has normal reflexes.  Skin: Skin is warm and dry.  Psychiatric: She has a normal mood and affect.    Prenatal labs: ABO, Rh: --/--/O NEG (03/26 1035) Antibody: POS (03/26 1035) Rubella: Immune (09/12 0000) RPR: Non Reactive (03/26 1100)  HBsAg: Negative (09/12 0000)  HIV: Non-reactive (09/12 0000)  GBS:   Pending  Assessment/Plan: 36 wks and 0 days with h/o  classical cesarean section with myomectomy at time of cesarean section. Recommend repeat cesarean section. R/b/a of c/section discussed with the patient including but not limited to infecton/ bleeding damage to bowel bladder baby with the need for further surgery. R/o Transfusion discussed. Pt voiced understanding and desires to proceed.    Emily Luty J. 05/03/2017, 8:57 AM

## 2017-05-03 NOTE — Anesthesia Preprocedure Evaluation (Signed)
Anesthesia Evaluation  Patient identified by MRN, date of birth, ID band Patient awake    Reviewed: Allergy & Precautions, H&P , NPO status , Patient's Chart, lab work & pertinent test results  Airway Mallampati: III       Dental no notable dental hx.    Pulmonary asthma , Recent URI , former smoker,    Pulmonary exam normal breath sounds clear to auscultation       Cardiovascular Exercise Tolerance: Good negative cardio ROS  + Valvular Problems/Murmurs  Rhythm:regular Rate:Normal     Neuro/Psych  Headaches, negative psych ROS   GI/Hepatic negative GI ROS, Neg liver ROS,   Endo/Other  negative endocrine ROSdiabetesMorbid obesity  Renal/GU negative Renal ROS  negative genitourinary   Musculoskeletal   Abdominal Normal abdominal exam  (+)   Peds  Hematology negative hematology ROS (+)   Anesthesia Other Findings   Reproductive/Obstetrics (+) Pregnancy                             Anesthesia Physical  Anesthesia Plan  ASA: III  Anesthesia Plan: Spinal   Post-op Pain Management:    Induction: Intravenous  PONV Risk Score and Plan: Ondansetron, Dexamethasone and Scopolamine patch - Pre-op  Airway Management Planned:   Additional Equipment:   Intra-op Plan:   Post-operative Plan:   Informed Consent: I have reviewed the patients History and Physical, chart, labs and discussed the procedure including the risks, benefits and alternatives for the proposed anesthesia with the patient or authorized representative who has indicated his/her understanding and acceptance.   Dental advisory given  Plan Discussed with: CRNA  Anesthesia Plan Comments:         Anesthesia Quick Evaluation

## 2017-05-03 NOTE — Anesthesia Postprocedure Evaluation (Signed)
Anesthesia Post Note  Patient: Emily Moss  Procedure(s) Performed: CESAREAN SECTION (N/A )     Patient location during evaluation: PACU Anesthesia Type: Spinal Level of consciousness: awake and alert Pain management: pain level controlled Vital Signs Assessment: post-procedure vital signs reviewed and stable Respiratory status: spontaneous breathing and respiratory function stable Cardiovascular status: blood pressure returned to baseline and stable Postop Assessment: spinal receding Anesthetic complications: no    Last Vitals:  Vitals:   05/03/17 1600 05/03/17 1624  BP: 109/75 116/79  Pulse: 67 66  Resp: 16 18  Temp: 36.4 C 36.4 C  SpO2: 99% 100%    Last Pain:  Vitals:   05/03/17 1642  TempSrc:   PainSc: 0-No pain   Pain Goal:                 Nolon Nations

## 2017-05-03 NOTE — H&P (Signed)
Date of Initial H&P: 05/02/2017 History reviewed, patient examined, no change in status, stable for surgery.

## 2017-05-03 NOTE — Anesthesia Procedure Notes (Signed)
Spinal  Patient location during procedure: OR Staffing Anesthesiologist: Nolon Nations, MD Performed: anesthesiologist  Preanesthetic Checklist Completed: patient identified, site marked, surgical consent, pre-op evaluation, timeout performed, IV checked, risks and benefits discussed and monitors and equipment checked Spinal Block Patient position: sitting Prep: site prepped and draped and DuraPrep Patient monitoring: heart rate, continuous pulse ox and blood pressure Approach: midline Location: L3-4 Injection technique: single-shot Needle Needle type: Sprotte  Needle gauge: 24 G Needle length: 9 cm Assessment Sensory level: T6 Additional Notes Expiration date of kit checked and confirmed. Patient tolerated procedure well, without complications.

## 2017-05-03 NOTE — Transfer of Care (Signed)
Immediate Anesthesia Transfer of Care Note  Patient: Emily Moss  Procedure(s) Performed: CESAREAN SECTION (N/A )  Patient Location: PACU  Anesthesia Type:Spinal  Level of Consciousness: awake, alert  and oriented  Airway & Oxygen Therapy: Patient Spontanous Breathing  Post-op Assessment: Report given to RN and Post -op Vital signs reviewed and stable  Post vital signs: Reviewed and stable  Last Vitals:  Vitals Value Taken Time  BP    Temp    Pulse    Resp    SpO2      Last Pain:  Vitals:   05/03/17 1113  TempSrc: Oral  PainSc: 0-No pain         Complications: No apparent anesthesia complications

## 2017-05-03 NOTE — Progress Notes (Signed)
CBG in PACU 100

## 2017-05-03 NOTE — Op Note (Signed)
Cesarean Section Procedure Note  Indications: previous uterine incision classical  Pre-operative Diagnosis: 36 week 0 day pregnancy.  Post-operative Diagnosis: same  Surgeon: Catha Brow.   Assistants: Dr. Janyth Pupa   Anesthesia: Spinal anesthesia  ASA Class: 2   Procedure Details   The patient was seen in the Holding Room. The risks, benefits, complications, treatment options, and expected outcomes were discussed with the patient.  The patient concurred with the proposed plan, giving informed consent.  The site of surgery properly noted/marked. The patient was taken to Operating Room # 9, identified as Dalylah Ramey and the procedure verified as C-Section Delivery. A Time Out was held and the above information confirmed.  After induction of anesthesia, the patient was draped and prepped in the usual sterile manner. A Pfannenstiel incision was made and carried down through the subcutaneous tissue to the fascia. Fascial incision was made and extended transversely. The fascia was separated from the underlying rectus tissue superiorly and inferiorly. The peritoneum was identified and entered. Peritoneal incision was extended longitudinally. The utero-vesical peritoneal reflection was incised transversely and the bladder flap was bluntly freed from the lower uterine segment. A low transverse uterine incision was made. Delivered from cephalic presentation was a  Female with Apgar scores of 8 at one minute and 9 at five minutes. After the umbilical cord was clamped and cut cord blood was obtained for evaluation. The placenta was removed intact and appeared normal. The uterine outline, tubes and ovaries appeared normal. The uterine incision was closed with running locked sutures of Vicryl. A second layer of zero vicryl was used to imbricate the incision. . Hemostasis was observed. Lavage was carried out until clear. The fascia was then reapproximated with running sutures of 0 pds. The skin was  reapproximated with 4-0 vicryl.  Instrument, sponge, and needle counts were correct prior the abdominal closure and at the conclusion of the case.   Findings: Adhesions of the serosa of the uterus to the anterior adominal wall and pelvic side wall on the left side. adehesions of the bladder to the lower uterine segment. Female fetus in the cephalic presentation. Nuchal cord x 1 .. Normal fallopian tubes and ovaries bilaterally   Estimated Blood Loss:  859 mL         Drains: None         Total IV Fluids:  Per anesthesia ml         Specimens: Placenta and sent to labor and delivery            Implants: None         Complications:  None; patient tolerated the procedure well.         Disposition: PACU - hemodynamically stable.         Condition: stable  Attending Attestation: I performed the procedure.

## 2017-05-03 NOTE — H&P (Deleted)
  The note originally documented on this encounter has been moved the the encounter in which it belongs.  

## 2017-05-03 NOTE — Anesthesia Postprocedure Evaluation (Signed)
Anesthesia Post Note  Patient: Emily Moss  Procedure(s) Performed: CESAREAN SECTION (N/A )     Patient location during evaluation: Mother Baby Anesthesia Type: Spinal Level of consciousness: awake Pain management: satisfactory to patient Vital Signs Assessment: post-procedure vital signs reviewed and stable Respiratory status: spontaneous breathing Cardiovascular status: stable Anesthetic complications: no    Last Vitals:  Vitals:   05/03/17 1624 05/03/17 1755  BP: 116/79 121/74  Pulse: 66 64  Resp: 18 18  Temp: 36.4 C   SpO2: 100% 99%    Last Pain:  Vitals:   05/03/17 1750  TempSrc:   PainSc: 0-No pain   Pain Goal:                 [employer]

## 2017-05-04 LAB — CBC
HEMATOCRIT: 32.3 % — AB (ref 36.0–46.0)
HEMOGLOBIN: 10.7 g/dL — AB (ref 12.0–15.0)
MCH: 26.4 pg (ref 26.0–34.0)
MCHC: 33.1 g/dL (ref 30.0–36.0)
MCV: 79.6 fL (ref 78.0–100.0)
PLATELETS: 263 10*3/uL (ref 150–400)
RBC: 4.06 MIL/uL (ref 3.87–5.11)
RDW: 15 % (ref 11.5–15.5)
WBC: 12.9 10*3/uL — AB (ref 4.0–10.5)

## 2017-05-04 MED ORDER — RHO D IMMUNE GLOBULIN 1500 UNIT/2ML IJ SOSY
300.0000 ug | PREFILLED_SYRINGE | Freq: Once | INTRAMUSCULAR | Status: AC
  Administered 2017-05-04: 300 ug via INTRAVENOUS
  Filled 2017-05-04: qty 2

## 2017-05-04 NOTE — Progress Notes (Signed)
Subjective: Postop Day 1: Cesarean Delivery No complaints.  Pain controlled.  Lochia normal.  Breast feeding yes.  Pt has not ambulated in the hallways.  Objective: Temp:  [98.1 F (36.7 C)-98.7 F (37.1 C)] 98.7 F (37.1 C) (03/28 0812) Pulse Rate:  [62-88] 88 (03/28 1158) Resp:  [16-18] 18 (03/28 1158) BP: (103-128)/(55-93) 109/58 (03/28 1158) SpO2:  [97 %-99 %] 97 % (03/28 1158)  Physical Exam: Gen: NAD Lochia: Not visualized Uterine Fundus: firm, appropriately tender Incision: Pressure dressing still in place.  Honey DVT Evaluation: + Edema present, no calf tenderness bilaterally   Recent Labs    05/02/17 1100 05/04/17 0600  HGB 12.0 10.7*  HCT 36.8 32.3*    Assessment/Plan: Status post C-section-doing well postoperatively. Encouraged ambulation in halls TID. Lactation support. Discharge home tomorrow if baby discharged. Dr. Landry Mellow to resume care at 7 am.    Thurnell Lose 05/04/2017

## 2017-05-04 NOTE — Lactation Note (Signed)
This note was copied from a baby's chart. Lactation Consultation Note Baby 60 hrs old. LPI 36 0/7 wks. 5.6 lbs. Reviewed w/mom LPI policy. Mom has everted nipples. Mom states baby feeding well at times. Reviewed LPI behavior, feeding habits, STS, I&O, supply and demand. Mom BF 20 min. LC Hand expressed 5 ml colostrum. [place] fed baby. Discussed importance of supplementing after feedings. Reviewed amounts needed after BF. If unable to meet amount needed w/colostrum, then will need to supplement w/22 cal. Formula. Mom in agreement. Baby took well. Has gentle suckle. Mom encouraged to waken baby for feeds if hasn't cued in 3 hrs. Report to RN if baby isn't feeding well. Mom encouraged to feed baby 8-12 times/24 hours and with feeding cues.   RN sat DEBP up in rm. Mom hasn't pumped yet. Encouraged mom to pump 5-6 times a day or every 3 hrs to supplement with. Mom has not pumped at this time.  Encouraged mom to call for assistance or questions.  [place] brochure given w/resources, support groups and [facility] services.  Patient Name: Emily Moss [id number] Date: 05/04/2017 Reason for consult: Initial assessment;Late-preterm 34-36.6wks;Infant < 6lbs   Maternal Data Has patient been taught Hand Expression?: Yes Does the patient have breastfeeding experience prior to this delivery?: Yes  Feeding Feeding Type: Breast Milk Length of feed: 20 min  LATCH Score Latch: Grasps breast easily, tongue down, lips flanged, rhythmical sucking.  Audible Swallowing: Spontaneous and intermittent  Type of Nipple: Everted at rest and after stimulation  Comfort (Breast/Nipple): Soft / non-tender  Hold (Positioning): No assistance needed to correctly position infant at breast.  LATCH Score: 10  Interventions Interventions: Breast feeding basics reviewed;Support pillows;Position options;Skin to skin;Expressed milk;Breast massage;Hand express;Pre-pump if needed;Breast compression;DEBP  Lactation Tools  Discussed/Used Tools: Pump Breast pump type: Double-Electric Breast Pump Pump Review: Setup, frequency, and cleaning;Milk Storage Initiated by:: RN Date initiated:: 05/03/17   Consult Status Consult Status: Follow-up Date: 05/04/17 Follow-up type: In-patient    Theodoro Kalata 05/04/2017, 2:44 AM

## 2017-05-04 NOTE — Lactation Note (Signed)
This note was copied from a baby's chart. Lactation Consultation Note  Patient Name: Girl Eustacia Urbanek [id number] Date: 05/04/2017 Reason for consult: Follow-up assessment;Late-preterm 34-36.6wks;Infant < 6lbs   LC visit for a follow up: Infant is now 31 hours old and a LPI weighing < 6 lbs. (4% weight loss)  Infant is doing STS with mother who had a C-section yesterday at 1359.  Infant has recently had a bath and is sleeping on mother's chest.  Infant has breastfed recently.  LC encouraged STS at any time and to watch the infant for feeding cues. Reviewed how important it is to maintain the infant's temp for increased energy levels which will help with feeding endurance and strength.  Mother states she has been pumping but not getting much milk.  LC reviewed that this is normal but encouraged pumping at least 8-12 times if infant is not latching and that the baby will need to feed.  LC put phone number on white board and mother will call at next feeding for latch assistance/assessment.     Consult Status Consult Status: Follow-up Date: 05/04/17 Follow-up type: In-patient    Laquida Cotrell R Alayzia Pavlock 05/04/2017, 11:02 AM

## 2017-05-04 NOTE — Lactation Note (Signed)
This note was copied from a baby's chart. Lactation Consultation Note  Patient Name: Emily Moss [id number] Date: 05/04/2017 Reason for consult: Follow-up assessment;Late-preterm 34-36.6wks;Infant < 6lbs  Called to assist with a latch.  Baby swaddled in GMOB's arms.  Recommended that Mom keep baby STS as much as possible.  Assisted with football hold on left breast.  Hand expression demonstrated, colostrum easily expressed.  Baby latched after a couple attempts with guidance.  Stressed importance of baby opening widely, and latching onto areola.  Baby sucked with deep jaw extensions, identifying a few swallows.  Hand expressed right breast while baby was on left side, and obtained 2 ml that was spoon fed to baby after she breastfed.  Nipple rounded and pulled out after baby came off.  Mom taught to use alternate breast compression during sucking to increase milk transfer.  Pump set up at bedside.  Mom states she has pumped once.  Plan- 1-Breastfeed on cue, at least every 3 hrs. 2- Limit time at breast to 30 mins 3-Pump both breasts on initiation setting for 15 mins, adding breast massage and hand expression 4- supplement with EBM+/formula to volume needed per LPTI guidelines..  RN aware of feeding plan.   Feeding Feeding Type: Breast Fed Length of feed: 15 min  LATCH Score Latch: Grasps breast easily, tongue down, lips flanged, rhythmical sucking.  Audible Swallowing: A few with stimulation  Type of Nipple: Everted at rest and after stimulation  Comfort (Breast/Nipple): Soft / non-tender  Hold (Positioning): Assistance needed to correctly position infant at breast and maintain latch.  LATCH Score: 8  Interventions Interventions: Breast feeding basics reviewed;Assisted with latch;Skin to skin;Breast massage;Hand express;Breast compression;Adjust position;Support pillows;Position options;Expressed milk;DEBP  Lactation Tools Discussed/Used Tools: Pump Breast pump type:  Double-Electric Breast Pump   Consult Status Consult Status: Follow-up Date: 05/05/17 Follow-up type: [place] 05/04/2017, 1:00 PM

## 2017-05-04 NOTE — Plan of Care (Signed)
  Problem: Pain Managment: Goal: General experience of comfort will improve Outcome: Progressing   Problem: Safety: Goal: Ability to remain free from injury will improve Outcome: Progressing   Problem: Skin Integrity: Goal: Risk for impaired skin integrity will decrease Outcome: Progressing   Problem: Education: Goal: Knowledge of General Education information will improve Outcome: Completed/Met   Problem: Education: Goal: Knowledge of General Education information will improve Outcome: Completed/Met

## 2017-05-05 MED ORDER — IBUPROFEN 600 MG PO TABS: 600.0000 mg | ORAL_TABLET | Freq: Four times a day (QID) | ORAL | 1 refills | Status: DC | PRN

## 2017-05-05 MED ORDER — TETANUS-DIPHTH-ACELL PERTUSSIS 5-2.5-18.5 LF-MCG/0.5 IM SUSP
0.5000 mL | Freq: Once | INTRAMUSCULAR | Status: AC
  Administered 2017-05-05: 0.5 mL via INTRAMUSCULAR
  Filled 2017-05-05: qty 0.5

## 2017-05-05 MED ORDER — OXYCODONE HCL 5 MG PO TABS: 5.0000 mg | ORAL_TABLET | ORAL | 0 refills | Status: DC | PRN

## 2017-05-05 NOTE — Discharge Summary (Signed)
OB Discharge Summary     Patient Name: Emily Moss DOB: 03/28/1980 MRN: [id number]  Date of admission: 05/03/2017 Delivering MD: Christophe Louis   Date of discharge: 05/05/2017  Admitting diagnosis: Z98.891 Hx of Classical Cesarean Section Intrauterine pregnancy: [redacted]w[redacted]d     Secondary diagnosis:  Active Problems:   S/P cesarean section  Additional problems: None     Discharge diagnosis: Preterm Pregnancy Delivered and GDM A2                                                                                                Post partum procedures:None  Augmentation: NA  Complications: None  Hospital course:  Sceduled C/S   37 y.o. yo G2P1102 at [redacted]w[redacted]d was admitted to the hospital 05/03/2017 for scheduled cesarean section with the following indication:Prior Uterine Surgery.  Membrane Rupture Time/Date: 1:58 PM ,05/03/2017   Patient delivered a Viable infant.05/03/2017  Details of operation can be found in separate operative note.  Pateint had an uncomplicated postpartum course.  She is ambulating, tolerating a regular diet, passing flatus, and urinating well. Patient is discharged home in stable condition on  05/05/17         Physical exam  Vitals:   05/04/17 0812 05/04/17 1158 05/04/17 1831 05/05/17 0559  BP: (!) 103/57 (!) 109/58 (!) 139/51 134/67  Pulse: 71 88 89 84  Resp: 17 18 19 18   Temp: 98.7 F (37.1 C)  98.3 F (36.8 C) 98.4 F (36.9 C)  TempSrc: Oral  Oral Oral  SpO2: 98% 97%  99%  Weight:      Height:       General: alert, cooperative and no distress Lochia: appropriate Uterine Fundus: firm Incision: Healing well with no significant drainage DVT Evaluation: No evidence of DVT seen on physical exam. Labs: Lab Results  Component Value Date   WBC 12.9 (H) 05/04/2017   HGB 10.7 (L) 05/04/2017   HCT 32.3 (L) 05/04/2017   MCV 79.6 05/04/2017   PLT 263 05/04/2017   CMP Latest Ref Rng & Units 04/28/2017  Glucose 65 - 99 mg/dL 127(H)  BUN 6 - 20 mg/dL 20  Creatinine  0.44 - 1.00 mg/dL 0.61  Sodium 135 - 145 mmol/L 135  Potassium 3.5 - 5.1 mmol/L 4.3  Chloride 101 - 111 mmol/L 106  CO2 22 - 32 mmol/L 19(L)  Calcium 8.9 - 10.3 mg/dL 9.0  Total Protein 6.5 - 8.1 g/dL 6.1(L)  Total Bilirubin 0.3 - 1.2 mg/dL 0.5  Alkaline Phos 38 - 126 U/L 126  AST 15 - 41 U/L 20  ALT 14 - 54 U/L 10(L)    Discharge instruction: per After Visit Summary and "Baby and Me Booklet".  After visit meds:  Allergies as of 05/05/2017   No Known Allergies     Medication List    STOP taking these medications   glyBURIDE 5 MG tablet Commonly known as:  DIABETA     TAKE these medications   acetaminophen 500 MG tablet Commonly known as:  TYLENOL Take 500 mg by mouth every 6 (six) hours as needed (for pain.).   ibuprofen  600 MG tablet Commonly known as:  ADVIL,MOTRIN Take 1 tablet (600 mg total) by mouth every 6 (six) hours as needed.   oxyCODONE 5 MG immediate release tablet Commonly known as:  Oxy IR/ROXICODONE Take 1 tablet (5 mg total) by mouth every 4 (four) hours as needed (pain scale 4-7).   PRENATAL ADULT GUMMY/DHA/FA PO Take 2 tablets by mouth daily.       Diet: routine diet  Activity: Advance as tolerated. Pelvic rest for 6 weeks.   Outpatient follow up:2 weeks Follow up Appt:No future appointments. Follow up Visit:No follow-ups on file.  Postpartum contraception: Not Discussed  Newborn Data: Live born female  Birth Weight: 5 lb 6.4 oz (2450 g) APGAR: 72, 9  Newborn Delivery   Birth date/time:  05/03/2017 13:59:00 Delivery type:  C-Section, Low Transverse C-section categorization:  Repeat     Baby Feeding: Breast Disposition:home with mother   05/05/2017 Catha Brow., MD

## 2017-05-05 NOTE — Lactation Note (Signed)
This note was copied from a baby's chart. Lactation Consultation Note  Patient Name: Emily Moss UTMLY'Y Date: 05/05/2017   [facility] visit prior to discharge:  G1 mother whose infant is 56 weeks, under 6 lbs, and 60 hours of age.  Pediatrician has entered orders for infant to be discharged after 48 hours.  Mother has been putting infant to breast and using the SNS.  She feels comfortable using this system.  Continued to encourage mother to massage, hand express before and after feeds and to post pump for increased milk supply.  Mother verbalized that breasts and nipples feel soft and non tender and she is aware of engorgement prevention/treatment.  Showed mother the volume that baby should have once she reaches the 48 hour mark using the "Caring for your late preterm baby" handout.  Encouraged her to continue keeping a log of feedings, voids and stools for the next few weeks.  Mother has her own DEBP for home use.  LC offered to make an OP visit for mother in about a week and mother agreed.    LC offered to watch the next latch if mother desires before being discharged.  Mother will call if needed.      [name] 05/05/2017, 10:57 AM

## 2017-05-06 LAB — BPAM RBC
BLOOD PRODUCT EXPIRATION DATE: 201904022359
BLOOD PRODUCT EXPIRATION DATE: 201904062359
ISSUE DATE / TIME: 201903290933
UNIT TYPE AND RH: 9500
Unit Type and Rh: 9500

## 2017-05-06 LAB — TYPE AND SCREEN
ABO/RH(D): O NEG
ANTIBODY SCREEN: POSITIVE
UNIT DIVISION: 0
Unit division: 0

## 2017-05-06 LAB — RH IG WORKUP (INCLUDES ABO/RH)
ABO/RH(D): O NEG
FETAL SCREEN: NEGATIVE
Gestational Age(Wks): 36
UNIT DIVISION: 0

## 2017-05-06 NOTE — Lactation Note (Signed)
This note was copied from a baby's chart. Lactation Consultation Note  Patient Name: Emily Moss SHFWY'[id number] Date: 05/06/2017 Reason for consult: Late-preterm 34-36.6wks;Infant < 6lbs   Baby is 17 hours old.  36 w < 5 lbs. Mother is breastfeeding is 5 french and supplementing w/ breastmilk and formula. Mother recently pumped 20 ml. Discussed limited feedings to 30 min. Mother is pumping and giving the difference w/ formula. Mother has personal DEBP at home. Mom encouraged to feed baby 8-12 times/24 hours and with feeding cues at least q 3 hours.  Reviewed engorgement care and monitoring voids/stools.     Maternal Data    Feeding Feeding Type: Breast Fed Length of feed: 15 min  LATCH Score                   Interventions    Lactation Tools Discussed/Used Tools: Supplemental Nutrition System   Consult Status Consult Status: Complete    Carlye Grippe 05/06/2017, 10:48 AM

## 2017-05-06 NOTE — Discharge Summary (Signed)
OB Discharge Summary     Patient Name: Emily Moss DOB: [birth year] MRN: [id number]  Date of admission: 05/03/2017 Delivering MD: Christophe Louis   Date of discharge: 05/06/2017  Admitting diagnosis: Z98.891 Hx of Classical Cesarean Section Intrauterine pregnancy: Unknown     Secondary diagnosis:  Active Problems:   S/P cesarean section  Additional problems: None     Discharge diagnosis: Term Pregnancy Delivered                                                                                                Post partum procedures:None  Augmentation: None  Complications: None  Hospital course:  Sceduled C/S   37 y.o. yo G2P1102 at Unknown was admitted to the hospital 05/03/2017 for scheduled cesarean section with the following indication:Elective Repeat, Prior Uterine Surgery and prior classical incision.  Membrane Rupture Time/Date: 1:58 PM ,05/03/2017   Patient delivered a Viable infant.05/03/2017  Details of operation can be found in separate operative note.  Pateint had an uncomplicated postpartum course.  She is ambulating, tolerating a regular diet, passing flatus, and urinating well. Patient is discharged home in stable condition on  05/06/17         Physical exam  Vitals:   05/04/17 0812 05/04/17 1158 05/04/17 1831 05/05/17 0559  BP: (!) 103/57 (!) 109/58 (!) 139/51 134/67  Pulse: 71 88 89 84  Resp: 17 18 19 18   Temp: 98.7 F (37.1 C)  98.3 F (36.8 C) 98.4 F (36.9 C)  TempSrc: Oral  Oral Oral  SpO2: 98% 97%  99%  Weight:      Height:       General: alert, cooperative and no distress Lochia: appropriate Uterine Fundus: firm Incision: Dressing is clean, dry, and intact DVT Evaluation: No evidence of DVT seen on physical exam. Negative Homan's sign. Labs: Lab Results  Component Value Date   WBC 12.9 (H) 05/04/2017   HGB 10.7 (L) 05/04/2017   HCT 32.3 (L) 05/04/2017   MCV 79.6 05/04/2017   PLT 263 05/04/2017   CMP Latest Ref Rng & Units 04/28/2017  Glucose  65 - 99 mg/dL 127(H)  BUN 6 - 20 mg/dL 20  Creatinine 0.44 - 1.00 mg/dL 0.61  Sodium 135 - 145 mmol/L 135  Potassium 3.5 - 5.1 mmol/L 4.3  Chloride 101 - 111 mmol/L 106  CO2 22 - 32 mmol/L 19(L)  Calcium 8.9 - 10.3 mg/dL 9.0  Total Protein 6.5 - 8.1 g/dL 6.1(L)  Total Bilirubin 0.3 - 1.2 mg/dL 0.5  Alkaline Phos 38 - 126 U/L 126  AST 15 - 41 U/L 20  ALT 14 - 54 U/L 10(L)    Discharge instruction: per After Visit Summary and "Baby and Me Booklet".  After visit meds:  Allergies as of 05/06/2017   No Known Allergies     Medication List    STOP taking these medications   glyBURIDE 5 MG tablet Commonly known as:  DIABETA     TAKE these medications   acetaminophen 500 MG tablet Commonly known as:  TYLENOL Take 500 mg by mouth every 6 (six) hours as  needed (for pain.).   ibuprofen 600 MG tablet Commonly known as:  ADVIL,MOTRIN Take 1 tablet (600 mg total) by mouth every 6 (six) hours as needed.   oxyCODONE 5 MG immediate release tablet Commonly known as:  Oxy IR/ROXICODONE Take 1 tablet (5 mg total) by mouth every 4 (four) hours as needed (pain scale 4-7).   PRENATAL ADULT GUMMY/DHA/FA PO Take 2 tablets by mouth daily.       Diet: routine diet  Activity: Advance as tolerated. Pelvic rest for 6 weeks.   Outpatient follow up:2 weeks Follow up Appt:No future appointments. Follow up Visit:No follow-ups on file.  Postpartum contraception: Undecided  Newborn Data: Live born female  Birth Weight: 5 lb 6.4 oz (2450 g) APGAR: 8, 9  Newborn Delivery   Birth date/time:  05/03/2017 13:59:00 Delivery type:  C-Section, Low Transverse C-section categorization:  Repeat     Baby Feeding: Breast Disposition:home with mother   05/06/2017 Starla Link, CNM

## 2017-10-31 DIAGNOSIS — Z0000 Encounter for general adult medical examination without abnormal findings: Secondary | ICD-10-CM | POA: Diagnosis not present

## 2017-11-21 DIAGNOSIS — Z01419 Encounter for gynecological examination (general) (routine) without abnormal findings: Secondary | ICD-10-CM | POA: Diagnosis not present

## 2017-11-21 DIAGNOSIS — Z8632 Personal history of gestational diabetes: Secondary | ICD-10-CM | POA: Diagnosis not present

## 2018-12-25 DIAGNOSIS — Z23 Encounter for immunization: Secondary | ICD-10-CM | POA: Diagnosis not present

## 2018-12-25 DIAGNOSIS — Z01419 Encounter for gynecological examination (general) (routine) without abnormal findings: Secondary | ICD-10-CM | POA: Diagnosis not present

## 2019-01-23 ENCOUNTER — Other Ambulatory Visit: Payer: Self-pay

## 2019-01-23 ENCOUNTER — Ambulatory Visit: Payer: BC Managed Care – PPO | Attending: Internal Medicine

## 2019-01-23 DIAGNOSIS — Z20822 Contact with and (suspected) exposure to covid-19: Secondary | ICD-10-CM

## 2019-01-23 DIAGNOSIS — Z20828 Contact with and (suspected) exposure to other viral communicable diseases: Secondary | ICD-10-CM | POA: Diagnosis not present

## 2019-01-24 NOTE — Progress Notes (Signed)
Order(s) created erroneously. Erroneous order ID: [id number]  Order moved by: Brigitte Pulse  Order move date/time: 01/24/2019 6:31 PM  Source Patient: [id number]  Source Contact: 01/23/2019  Destination Patient: [id number]  Destination Contact: 01/23/2019

## 2019-01-24 NOTE — Progress Notes (Signed)
Moving orders to this encounter.  

## 2019-01-25 LAB — NOVEL CORONAVIRUS, NAA: SARS-CoV-2, NAA: NOT DETECTED

## 2019-06-03 DIAGNOSIS — Z0001 Encounter for general adult medical examination with abnormal findings: Secondary | ICD-10-CM | POA: Diagnosis not present

## 2019-06-03 DIAGNOSIS — R030 Elevated blood-pressure reading, without diagnosis of hypertension: Secondary | ICD-10-CM | POA: Diagnosis not present

## 2019-09-02 DIAGNOSIS — Z0001 Encounter for general adult medical examination with abnormal findings: Secondary | ICD-10-CM | POA: Diagnosis not present

## 2019-10-22 DIAGNOSIS — Z0001 Encounter for general adult medical examination with abnormal findings: Secondary | ICD-10-CM | POA: Diagnosis not present

## 2019-10-22 DIAGNOSIS — Z833 Family history of diabetes mellitus: Secondary | ICD-10-CM | POA: Diagnosis not present

## 2020-01-27 DIAGNOSIS — Z23 Encounter for immunization: Secondary | ICD-10-CM | POA: Diagnosis not present

## 2020-01-27 DIAGNOSIS — Z01419 Encounter for gynecological examination (general) (routine) without abnormal findings: Secondary | ICD-10-CM | POA: Diagnosis not present

## 2020-02-12 DIAGNOSIS — Z20822 Contact with and (suspected) exposure to covid-19: Secondary | ICD-10-CM | POA: Diagnosis not present

## 2020-02-15 DIAGNOSIS — Z1152 Encounter for screening for COVID-19: Secondary | ICD-10-CM | POA: Diagnosis not present

## 2020-02-15 DIAGNOSIS — Z20822 Contact with and (suspected) exposure to covid-19: Secondary | ICD-10-CM | POA: Diagnosis not present

## 2020-09-09 DIAGNOSIS — F418 Other specified anxiety disorders: Secondary | ICD-10-CM | POA: Diagnosis not present

## 2020-09-09 DIAGNOSIS — R5383 Other fatigue: Secondary | ICD-10-CM | POA: Diagnosis not present

## 2020-09-12 ENCOUNTER — Other Ambulatory Visit: Payer: Self-pay

## 2020-09-12 ENCOUNTER — Encounter (HOSPITAL_BASED_OUTPATIENT_CLINIC_OR_DEPARTMENT_OTHER): Payer: Self-pay | Admitting: *Deleted

## 2020-09-12 ENCOUNTER — Emergency Department (HOSPITAL_BASED_OUTPATIENT_CLINIC_OR_DEPARTMENT_OTHER)
Admission: EM | Admit: 2020-09-12 | Discharge: 2020-09-12 | Disposition: A | Discharge status: Home / Self Care | Payer: BC Managed Care – PPO | Attending: Emergency Medicine | Admitting: Emergency Medicine

## 2020-09-12 DIAGNOSIS — J45909 Unspecified asthma, uncomplicated: Secondary | ICD-10-CM | POA: Insufficient documentation

## 2020-09-12 DIAGNOSIS — Z87891 Personal history of nicotine dependence: Secondary | ICD-10-CM | POA: Diagnosis not present

## 2020-09-12 DIAGNOSIS — I10 Essential (primary) hypertension: Secondary | ICD-10-CM | POA: Diagnosis not present

## 2020-09-12 DIAGNOSIS — E119 Type 2 diabetes mellitus without complications: Secondary | ICD-10-CM | POA: Diagnosis not present

## 2020-09-12 NOTE — ED Provider Notes (Signed)
[facility] EMERGENCY DEPT Provider Note   CSN: [id number] Arrival date & time: 09/12/20  1243     History Chief Complaint  Patient presents with   Hypertension    Emily Moss is a 40 y.o. female.  Patient has not been feeling well for a number of days.  She concerned about her blood pressure.  When she arrived blood pressure was 135/99.  Patient was started on hydrochlorothiazide by her primary care doctor about 5 days ago.  He is planning to follow that up.  Patient was seen at [facility] urgent care on August 3.  Had lab work they are to include CBC complete metabolic panel and thyroid-stimulating hormone without any significant abnormalities.  Patient has not had a history of hypertension in the past.  Patient denies any severe headache shortness of breath nausea vomiting or diarrhea vision changes or any strokelike symptoms.  Patient denies fevers no cough or any significant upper respiratory symptoms.  Patient without any intervention here blood pressures came down to 136/90.  Patient states that she is actually feeling better at this time which is reassuring.      Past Medical History:  Diagnosis Date   Abnormal Pap smear 2009   LAST PAP 05/2011   Asthma    CHILDHOOD   Diabetes mellitus    gestational- diet controlled   Eczema    Fibroid 2010   Gestational diabetes    H/O varicella    Headache(784.0)    one severe headache during pregnancy    Heart murmur    AT BIRTH RESOLVED   Heartburn in pregnancy    Infection    UTI X 1   Infection 2000, 2002,2008   CHLAMYDIA   Recurrent upper respiratory infection (URI)    RECURRENT BRONCHITIS    Patient Active Problem List   Diagnosis Date Noted   S/P cesarean section 05/03/2017   Impaired glucose tolerance test 04/05/2017   Cesarean delivery delivered 01/23/2012   Status post primary low transverse cesarean section, with myomectomy 01/22/2012   Obesity 01/20/2012   Gestational diabetes mellitus in  pregnancy 11/06/2011   Rh negative state in antepartum period 10/25/2011   Headache(784.0) 08/15/2011    Past Surgical History:  Procedure Laterality Date   CESAREAN SECTION  01/20/2012   Procedure: CESAREAN SECTION;  Surgeon: Eldred Manges, MD;  Location: [facility];  Service: Obstetrics;  Laterality: N/A;   CESAREAN SECTION N/A 05/03/2017   Procedure: CESAREAN SECTION;  Surgeon: Christophe Louis, MD;  Location: [facility];  Service: Obstetrics;  Laterality: N/A;  EDD 05/31/17   NO PAST SURGERIES     WISDOM TOOTH EXTRACTION  2011   x 2     OB History     Gravida  2   Para  2   Term  1   Preterm  1   AB      Living  2      SAB      IAB      Ectopic      Multiple  0   Live Births  2           Family History  Problem Relation Age of Onset   Drug abuse Sister    Drug abuse Brother    Alcohol abuse Maternal Uncle    Alcohol abuse Maternal Grandfather    Alzheimer's disease Maternal Grandfather    Asthma Other    Cancer Cousin        LUNG/BREAST/LUNG  Stroke Cousin    Drug abuse Cousin    Diabetes Mother    Hypertension Mother    Cancer Mother 9       BREAST   Lupus Paternal Grandmother    Cancer Cousin        PANCREATIC;COLON    Social History   Tobacco Use   Smoking status: Former    Types: Pipe, Cigars    Quit date: 02/27/2011    Years since quitting: 9.5   Smokeless tobacco: Never  Substance Use Topics   Alcohol use: No    Comment: [employer] W[employer]   Drug use: No    Home Medications Prior to Admission medications   Medication Sig Start Date End Date Taking? Authorizing Provider  acetaminophen (TYLENOL) 500 MG tablet Take 500 mg by mouth every 6 (six) hours as needed (for pain.).    [provider]  ibuprofen (ADVIL,MOTRIN) 600 MG tablet Take 1 tablet (600 mg total) by mouth every 6 (six) hours as needed. 05/05/17   Christophe Louis, MD  oxyCODONE (OXY IR/ROXICODONE) 5 MG immediate release tablet Take 1 tablet (5 mg total) by mouth  every 4 (four) hours as needed (pain scale 4-7). 05/05/17   Christophe Louis, MD  Prenatal MV & Min w/FA-DHA (PRENATAL ADULT GUMMY/DHA/FA PO) Take 2 tablets by mouth daily.    [provider]    Allergies    Patient has no known allergies.  Review of Systems   Review of Systems  Constitutional:  Positive for appetite change and fatigue. Negative for chills and fever.  HENT:  Negative for ear pain and sore throat.   Eyes:  Negative for pain and visual disturbance.  Respiratory:  Negative for cough and shortness of breath.   Cardiovascular:  Negative for chest pain and palpitations.  Gastrointestinal:  Negative for abdominal pain and vomiting.  Genitourinary:  Negative for dysuria and hematuria.  Musculoskeletal:  Negative for arthralgias and back pain.  Skin:  Negative for color change and rash.  Neurological:  Negative for seizures and syncope.  All other systems reviewed and are negative.  Physical Exam Updated Vital Signs BP (!) 142/98 (BP Location: Right Arm)   Pulse 64   Temp 98.3 F (36.8 C) (Oral)   Resp 20   Ht 1.651 m ('5\' 5"'$ )   Wt 98.4 kg   LMP 08/27/2020 (Exact Date)   SpO2 100%   BMI 36.11 kg/m   Physical Exam Vitals and nursing note reviewed.  Constitutional:      General: She is not in acute distress.    Appearance: Normal appearance. She is well-developed. She is ill-appearing. She is not toxic-appearing.  HENT:     Head: Normocephalic and atraumatic.  Eyes:     Extraocular Movements: Extraocular movements intact.     Conjunctiva/sclera: Conjunctivae normal.     Pupils: Pupils are equal, round, and reactive to light.  Cardiovascular:     Rate and Rhythm: Normal rate and regular rhythm.     Heart sounds: No murmur heard. Pulmonary:     Effort: Pulmonary effort is normal. No respiratory distress.     Breath sounds: Normal breath sounds. No wheezing, rhonchi or rales.  Abdominal:     Palpations: Abdomen is soft.     Tenderness: There is no abdominal  tenderness.  Musculoskeletal:        General: No swelling.     Cervical back: Neck supple.  Skin:    General: Skin is warm and dry.  Neurological:  General: No focal deficit present.     Mental Status: She is alert and oriented to person, place, and time.     Cranial Nerves: No cranial nerve deficit.     Sensory: No sensory deficit.     Motor: No weakness.  Patient nontoxic no acute distress  ED Results / Procedures / Treatments   Labs (all labs ordered are listed, but only abnormal results are displayed) Labs Reviewed - No data to display  EKG None  Radiology No results found.  Procedures Procedures   Medications Ordered in ED Medications - No data to display  ED Course  I have reviewed the triage vital signs and the nursing notes.  Pertinent labs & imaging results that were available during my care of the patient were reviewed by me and considered in my medical decision making (see chart for details).    MDM Rules/Calculators/A&P                           Patient nontoxic no acute distress.  Patient's blood pressure improved significantly here to 136/90.  Patient is taking her hydrochlorothiazide.  Patient had normal labs at urgent care just on August 3.  Do not see a reason to repeat them.  Patient without any significant endorgan symptoms.  Patient is taking her blood pressure daily and she is planning to follow-up with her primary care doctor to see the trending of her blood pressures on her new blood pressure medicine hydrochlorothiazide.  Patient will return for any new or worse symptoms Final Clinical Impression(s) / ED Diagnoses Final diagnoses:  Primary hypertension    Rx / DC Orders ED Discharge Orders     None        Fredia Sorrow, MD 09/12/20 2043

## 2020-09-12 NOTE — Discharge Instructions (Addendum)
Continue to keep your daily log of your blood pressures.  Check it once a day around the same time a day approximately 2 hours after you have taken your blood pressure medicine.  Take that log to your primary care doctor.  Also follow-up with your primary care doctor regarding the fatigue and other symptoms.  Your labs from the urgent care are very reassuring quite normal.  Return for any severe chest pain severe headache difficulty breathing or any strokelike symptoms.  Whether your blood pressure is abnormal or not.

## 2020-09-12 NOTE — ED Notes (Addendum)
Pt reports general feeling of "unwellness" and states concern over her BP which to her knowledge was never high.  Pt reports symptoms worse in the am, early satiety, decreased appetite and that she recently started HCTZ after noting her elevated BP 5 days ago.  Pt also reports mother recently ill with BP issues and that she has felt very worried about that as well.  Pt denies N/v/d, SOB, pain, headache, vision changes or other.  Pt reports she was seen at UC earlier this week, had blood work done and results came back normal.

## 2020-09-12 NOTE — ED Triage Notes (Signed)
HTN x 1 week.  HCTZ started 5 days ago

## 2020-10-27 DIAGNOSIS — I10 Essential (primary) hypertension: Secondary | ICD-10-CM | POA: Diagnosis not present

## 2020-10-27 DIAGNOSIS — D638 Anemia in other chronic diseases classified elsewhere: Secondary | ICD-10-CM | POA: Diagnosis not present

## 2020-10-27 DIAGNOSIS — Z79899 Other long term (current) drug therapy: Secondary | ICD-10-CM | POA: Diagnosis not present

## 2020-10-31 DIAGNOSIS — I10 Essential (primary) hypertension: Secondary | ICD-10-CM | POA: Diagnosis not present

## 2020-10-31 DIAGNOSIS — R519 Headache, unspecified: Secondary | ICD-10-CM | POA: Diagnosis not present

## 2020-11-01 ENCOUNTER — Emergency Department: Payer: BC Managed Care – PPO

## 2020-11-01 ENCOUNTER — Emergency Department
Admission: EM | Admit: 2020-11-01 | Discharge: 2020-11-01 | Disposition: A | Discharge status: Home / Self Care | Payer: BC Managed Care – PPO | Attending: Emergency Medicine | Admitting: Emergency Medicine

## 2020-11-01 ENCOUNTER — Other Ambulatory Visit: Payer: Self-pay

## 2020-11-01 ENCOUNTER — Encounter: Payer: Self-pay | Admitting: Emergency Medicine

## 2020-11-01 DIAGNOSIS — E119 Type 2 diabetes mellitus without complications: Secondary | ICD-10-CM | POA: Diagnosis not present

## 2020-11-01 DIAGNOSIS — R42 Dizziness and giddiness: Secondary | ICD-10-CM

## 2020-11-01 DIAGNOSIS — J45909 Unspecified asthma, uncomplicated: Secondary | ICD-10-CM | POA: Diagnosis not present

## 2020-11-01 DIAGNOSIS — R0602 Shortness of breath: Secondary | ICD-10-CM | POA: Insufficient documentation

## 2020-11-01 DIAGNOSIS — R519 Headache, unspecified: Secondary | ICD-10-CM | POA: Diagnosis not present

## 2020-11-01 DIAGNOSIS — Z87891 Personal history of nicotine dependence: Secondary | ICD-10-CM | POA: Diagnosis not present

## 2020-11-01 LAB — BASIC METABOLIC PANEL
Anion gap: 10 (ref 5–15)
BUN: 19 mg/dL (ref 6–20)
CO2: 24 mmol/L (ref 22–32)
Calcium: 9.3 mg/dL (ref 8.9–10.3)
Chloride: 100 mmol/L (ref 98–111)
Creatinine, Ser: 0.89 mg/dL (ref 0.44–1.00)
GFR, Estimated: >60 mL/min (ref >60)
Glucose, Bld: 155 mg/dL — ABNORMAL HIGH (ref 70–99)
Potassium: 2.9 mmol/L — ABNORMAL LOW (ref 3.5–5.1)
Sodium: 134 mmol/L — ABNORMAL LOW (ref 135–145)

## 2020-11-01 LAB — CBC
HCT: 38.7 % (ref 36.0–46.0)
Hemoglobin: 13.1 g/dL (ref 12.0–15.0)
MCH: 27.5 pg (ref 26.0–34.0)
MCHC: 33.9 g/dL (ref 30.0–36.0)
MCV: 81.3 fL (ref 80.0–100.0)
Platelets: 319 10*3/uL (ref 150–400)
RBC: 4.76 MIL/uL (ref 3.87–5.11)
RDW: 13.5 % (ref 11.5–15.5)
WBC: 5.9 10*3/uL (ref 4.0–10.5)
nRBC: 0 % (ref 0.0–0.2)

## 2020-11-01 LAB — TROPONIN I (HIGH SENSITIVITY)
Troponin I (High Sensitivity): <2 ng/L (ref <18)
Troponin I (High Sensitivity): <2 ng/L (ref <18)

## 2020-11-01 MED ORDER — POTASSIUM CHLORIDE CRYS ER 20 MEQ PO TBCR
40.0000 meq | EXTENDED_RELEASE_TABLET | Freq: Once | ORAL | Status: AC
  Administered 2020-11-01: 40 meq via ORAL
  Filled 2020-11-01: qty 2

## 2020-11-01 NOTE — Discharge Instructions (Addendum)
Please seek medical attention for any high fevers, chest pain, shortness of breath, change in behavior, persistent vomiting, bloody stool or any other new or concerning symptoms.  

## 2020-11-01 NOTE — ED Provider Notes (Signed)
Emergency Medicine Provider Triage Evaluation Note  Emily Moss , a 40 y.o. female  was evaluated in triage.  Pt complains of chest pain, SOB, and anxiety. She notes onset about 2 hours  prior. She had a work-up in an ED in [place] last night for similar symptoms. EKG and CT head scan were normal, no labs were drawn.   Review of Systems  Positive: CP, SOB Negative: Abd pain  Physical Exam  BP (!) 121/95 (BP Location: Left Arm)   Pulse (!) 109   Temp 98.4 F (36.9 C) (Oral)   Resp 20   SpO2 99%  Gen:   Awake, no distress  anxious Resp:  Normal effort CTA MSK:   Moves extremities without difficulty  Other:  CVS: RRR  Medical Decision Making  Medically screening exam initiated at 5:34 PM.  Appropriate orders placed.  Debrah Granderson was informed that the remainder of the evaluation will be completed by another provider, this initial triage assessment does not replace that evaluation, and the importance of remaining in the ED until their evaluation is complete.  Patient with ED evaluation of chest pain and SOB.    Melvenia Needles, PA-C 11/01/20 1737    Vanessa Vinton, MD 11/02/20 [phone]

## 2020-11-01 NOTE — ED Provider Notes (Signed)
[facility] Emergency Department Provider Note    ____________________________________________   I have reviewed the triage vital signs and the nursing notes.   HISTORY  Chief Complaint Dizziness   History limited by: Not Limited   HPI Emily Moss is a 40 y.o. female who presents to the emergency department today because oc concern for episode of dizziness, shortness of breath. The patient states that she was driving back from visiting inlaws in [place] when the symptoms started. She did go to an ED in [place] last night for similar symptoms and had work up done without obvious etiology. The patient did have high blood pressure at that time. She says that she has not been feeling well for a little while and has been having problems with high blood pressure. Was recently started on medication to help with her blood pressure. At the time of my exam the patient states she feels completely improved.   Records reviewed. Per medical record review patient has a history of DM.   Past Medical History:  Diagnosis Date   Abnormal Pap smear 2009   LAST PAP 05/2011   Asthma    CHILDHOOD   Diabetes mellitus    gestational- diet controlled   Eczema    Fibroid 2010   Gestational diabetes    H/O varicella    Headache(784.0)    one severe headache during pregnancy    Heart murmur    AT BIRTH RESOLVED   Heartburn in pregnancy    Infection    UTI X 1   Infection 2000, 2002,2008   CHLAMYDIA   Recurrent upper respiratory infection (URI)    RECURRENT BRONCHITIS    Patient Active Problem List   Diagnosis Date Noted   S/P cesarean section 05/03/2017   Impaired glucose tolerance test 04/05/2017   Cesarean delivery delivered 01/23/2012   Status post primary low transverse cesarean section, with myomectomy 01/22/2012   Obesity 01/20/2012   Gestational diabetes mellitus in pregnancy 11/06/2011   Rh negative state in antepartum period 10/25/2011   Headache(784.0)  08/15/2011    Past Surgical History:  Procedure Laterality Date   CESAREAN SECTION  01/20/2012   Procedure: CESAREAN SECTION;  Surgeon: Eldred Manges, MD;  Location: [facility];  Service: Obstetrics;  Laterality: N/A;   CESAREAN SECTION N/A 05/03/2017   Procedure: CESAREAN SECTION;  Surgeon: Christophe Louis, MD;  Location: [facility];  Service: Obstetrics;  Laterality: N/A;  EDD 05/31/17   NO PAST SURGERIES     WISDOM TOOTH EXTRACTION  2011   x 2    Prior to Admission medications   Medication Sig Start Date End Date Taking? Authorizing Provider  acetaminophen (TYLENOL) 500 MG tablet Take 500 mg by mouth every 6 (six) hours as needed (for pain.).    [provider]  ibuprofen (ADVIL,MOTRIN) 600 MG tablet Take 1 tablet (600 mg total) by mouth every 6 (six) hours as needed. 05/05/17   Christophe Louis, MD  oxyCODONE (OXY IR/ROXICODONE) 5 MG immediate release tablet Take 1 tablet (5 mg total) by mouth every 4 (four) hours as needed (pain scale 4-7). 05/05/17   Christophe Louis, MD  Prenatal MV & Min w/FA-DHA (PRENATAL ADULT GUMMY/DHA/FA PO) Take 2 tablets by mouth daily.    [provider]    Allergies Patient has no known allergies.  Family History  Problem Relation Age of Onset   Drug abuse Sister    Drug abuse Brother    Alcohol abuse Maternal Uncle  Alcohol abuse Maternal Grandfather    Alzheimer's disease Maternal Grandfather    Asthma Other    Cancer Cousin        LUNG/BREAST/LUNG   Stroke Cousin    Drug abuse Cousin    Diabetes Mother    Hypertension Mother    Cancer Mother 40       BREAST   Lupus Paternal Grandmother    Cancer Cousin        PANCREATIC;COLON    Social History Social History   Tobacco Use   Smoking status: Former    Types: Pipe, Cigars    Quit date: 02/27/2011    Years since quitting: 9.6   Smokeless tobacco: Never  Substance Use Topics   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  Eyes: No visual changes. ENT: No sore throat. Cardiovascular: Denies chest pain. Respiratory: Positive for shortness of breath. Gastrointestinal: No abdominal pain.  No nausea, no vomiting.  No diarrhea.   Genitourinary: Negative for dysuria. Musculoskeletal: Negative for back pain. Skin: Negative for rash. Neurological: Positive for dizziness.   ____________________________________________   PHYSICAL EXAM:  VITAL SIGNS: ED Triage Vitals  Enc Vitals Group     BP 11/01/20 1732 (!) 121/95     Pulse Rate 11/01/20 1732 (!) 109     Resp 11/01/20 1732 20     Temp 11/01/20 1732 98.4 F (36.9 C)     Temp Source 11/01/20 1732 Oral     SpO2 11/01/20 1732 99 %     Weight 11/01/20 1734 207 lb (93.9 kg)     Height 11/01/20 1734 5\' 5"  (1.651 m)     Head Circumference --      Peak Flow --      Pain Score 11/01/20 1733 3   Constitutional: Alert and oriented.  Eyes: Conjunctivae are normal.  ENT      Head: Normocephalic and atraumatic.      Nose: No congestion/rhinnorhea.      Mouth/Throat: Mucous membranes are moist.      Neck: No stridor. Hematological/Lymphatic/Immunilogical: No cervical lymphadenopathy. Cardiovascular: Normal rate, regular rhythm.  No murmurs, rubs, or gallops.  Respiratory: Normal respiratory effort without tachypnea nor retractions. Breath sounds are clear and equal bilaterally. No wheezes/rales/rhonchi. Gastrointestinal: Soft and non tender. No rebound. No guarding.  Genitourinary: Deferred Musculoskeletal: Normal range of motion in all extremities. No lower extremity edema. Neurologic:  Normal speech and language. No gross focal neurologic deficits are appreciated.  Skin:  Skin is warm, dry and intact. No rash noted. Psychiatric: Mood and affect are normal. Speech and behavior are normal. Patient exhibits appropriate insight and judgment.  ____________________________________________    LABS (pertinent positives/negatives)  Trop hs  <2 x 2 CBC wbc 5.9, hgb 13.1, plt 319 BMP na 134, k 2.9, glu 155, cr 0.89  ____________________________________________   EKG  I, Nance Pear, attending physician, personally viewed and interpreted this EKG  EKG Time: 1740 Rate: 80 Rhythm: normal sinus rhythm Axis: normal Intervals: qtc 461 QRS: narrow ST changes: no st elevation Impression: normal ekg  ____________________________________________    RADIOLOGY  CXR No acute cardiopulmonary abnormality  ____________________________________________   PROCEDURES  Procedures  ____________________________________________   INITIAL IMPRESSION / ASSESSMENT AND PLAN / ED COURSE  Pertinent labs & imaging results that were available during my care of the patient were reviewed by me and considered in my medical decision making (see  chart for details).   Patient presents to the emergency department today after an episode of dizziness, shortness of breath that has since resolved. Work up where without concerning troponin elevation. CXR without concerning findings. EKG without concerning findings. At this time do wonder if patient suffered from anxiety attack. In discussion with the patient id does sound like she has been having issues with her blood pressure and that has been on her mind a lot recently. Did have a discussion with the patient about blood pressure. Encouraged patient to continue to follow up with pcp. At this time doubt PE given quick resolution of symptoms, no leg swelling or pain.   ____________________________________________   FINAL CLINICAL IMPRESSION(S) / ED DIAGNOSES  Final diagnoses:  Dizziness  Shortness of breath     Note: This dictation was prepared with Dragon dictation. Any transcriptional errors that result from this process are unintentional     Nance Pear, MD 11/01/20 2045

## 2020-11-01 NOTE — ED Triage Notes (Signed)
Pt reports that she was visiting her in laws last night, she felt that she was getting dizzy and she took her B/P and it was high so she took her Losartan, it stayed elevated. She went to the ED in [place] and was discharged. Today she was in the car on the way home and began to get shaky and dizzy and felt like she was having a hard time breathing and her hands started tingling. Pt thinks she could be having a panic attack.

## 2020-12-23 ENCOUNTER — Telehealth: Payer: Self-pay

## 2020-12-23 NOTE — Telephone Encounter (Signed)
Wife of established patient Lauralyn, Shadowens like to become a new patient I informed her a message would be sent and if she accepts we will call to schedule

## 2020-12-28 NOTE — Telephone Encounter (Signed)
Okay to schedule as a new patient.

## 2021-01-05 NOTE — Telephone Encounter (Signed)
Patient is scheduled for 12/1. No further action needed

## 2021-01-07 ENCOUNTER — Ambulatory Visit: Payer: BC Managed Care – PPO | Admitting: Family Medicine

## 2021-01-07 VITALS — BP 112/76 | HR 81 | Temp 98.2°F | Ht 65.0 in | Wt 215.4 lb

## 2021-01-07 DIAGNOSIS — Z7689 Persons encountering health services in other specified circumstances: Secondary | ICD-10-CM

## 2021-01-07 DIAGNOSIS — I10 Essential (primary) hypertension: Secondary | ICD-10-CM | POA: Diagnosis not present

## 2021-01-07 DIAGNOSIS — R0683 Snoring: Secondary | ICD-10-CM | POA: Diagnosis not present

## 2021-01-07 DIAGNOSIS — R002 Palpitations: Secondary | ICD-10-CM

## 2021-01-07 NOTE — Patient Instructions (Signed)
Behavioral Health Services: -to make an appointment contact the office/provider you are interested in seeing.  No referral is needed.  The below is not an all inclusive list, but will help you get started.  [email or web address] -counseling located off of [address]  [email or web address] -website helps you find providers in your area  [facility] -Located off of [address] across from [employer]  Dr. Akintayo is a Psychiatrist with . [phone]  [facility]  [facility]  -[address]  [phone] -a place in town that has counseling and Psychiatry services.    
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
## 2021-01-19 ENCOUNTER — Encounter: Payer: Self-pay | Admitting: Family Medicine

## 2021-02-10 ENCOUNTER — Encounter: Payer: Self-pay | Admitting: Family Medicine

## 2021-02-10 ENCOUNTER — Ambulatory Visit: Payer: BC Managed Care – PPO | Admitting: Family Medicine

## 2021-02-10 ENCOUNTER — Other Ambulatory Visit: Payer: Self-pay

## 2021-02-10 ENCOUNTER — Ambulatory Visit (INDEPENDENT_AMBULATORY_CARE_PROVIDER_SITE_OTHER): Payer: BC Managed Care – PPO

## 2021-02-10 VITALS — BP 118/80 | HR 74 | Temp 98.7°F | Wt 215.6 lb

## 2021-02-10 DIAGNOSIS — M25469 Effusion, unspecified knee: Secondary | ICD-10-CM

## 2021-02-10 DIAGNOSIS — M25562 Pain in left knee: Secondary | ICD-10-CM | POA: Diagnosis not present

## 2021-02-10 NOTE — Progress Notes (Signed)
Subjective:    Patient ID: Emily Moss, female    DOB: Jun 13, 1980, 41 y.o.   MRN: [id number]  Chief Complaint  Patient presents with   Knee Pain    Left knee pain, going on a while.swollen, hurts when moving, when worked out, would ache but now it is not going away.    HPI Patient was seen today for acute concern.  Pt with L knee pain, initally started during [employer] classes.  Pain became more constant last month.  Knee feels tight, difficulty going up and down stairs.  Denies injury, hearing any pops, clicks, or tears.  Pt inquires about referral to pulmonology for sleep study.  Order placed at last [facility].  Past Medical History:  Diagnosis Date   Abnormal Pap smear 2009   LAST PAP 05/2011   Asthma    CHILDHOOD   Diabetes mellitus    gestational- diet controlled   Eczema    Fibroid 2010   Gestational diabetes    H/O varicella    Headache(784.0)    one severe headache during pregnancy    Heart murmur    AT BIRTH RESOLVED   Heartburn in pregnancy    Infection    UTI X 1   Infection 2000, 2002,2008   CHLAMYDIA   Recurrent upper respiratory infection (URI)    RECURRENT BRONCHITIS    No Known Allergies  ROS General: Denies fever, chills, night sweats, changes in weight, changes in appetite HEENT: Denies headaches, ear pain, changes in vision, rhinorrhea, sore throat CV: Denies CP, palpitations, SOB, orthopnea Pulm: Denies SOB, cough, wheezing GI: Denies abdominal pain, nausea, vomiting, diarrhea, constipation GU: Denies dysuria, hematuria, frequency, vaginal discharge Msk: Denies muscle cramps, joint pains + left knee pain Neuro: Denies weakness, numbness, tingling Skin: Denies rashes, bruising Psych: Denies depression, anxiety, hallucinations    Objective:    Blood pressure 118/80, pulse 74, temperature 98.7 F (37.1 C), temperature source Oral, weight 215 lb 9.6 oz (97.8 kg), SpO2 99 %, unknown if currently breastfeeding.  Gen. Pleasant, well-nourished, in no  distress, normal affect   HEENT: /AT, face symmetric, conjunctiva clear, no scleral icterus, PERRLA, EOMI, nares patent without drainage Lungs: no accessory muscle use Cardiovascular: RRR, no peripheral edema Musculoskeletal: L knee >R knee.  No crepitus or TTP of right knee.  Left knee with small effusion, mild TTP in popliteal fossa, no crepitus, negative Lachman and posterior drawer.  No deformities, no cyanosis or clubbing, normal tone Neuro:  A&Ox3, CN II-XII intact, normal gait Skin:  Warm, no lesions/ rash.  No increased warmth of left knee.   Wt Readings from Last 3 Encounters:  01/07/21 215 lb 6.4 oz (97.7 kg)  11/01/20 207 lb (93.9 kg)  09/12/20 217 lb (98.4 kg)    Lab Results  Component Value Date   WBC 5.9 11/01/2020   HGB 13.1 11/01/2020   HCT 38.7 11/01/2020   PLT 319 11/01/2020   GLUCOSE 155 (H) 11/01/2020   ALT 10 (L) 04/28/2017   AST 20 04/28/2017   NA 134 (L) 11/01/2020   K 2.9 (L) 11/01/2020   CL 100 11/01/2020   CREATININE 0.89 11/01/2020   BUN 19 11/01/2020   CO2 24 11/01/2020    Assessment/Plan:  Acute pain of left knee - Plan: DG Knee Complete 4 Views Left  Edema of knee -Minimal effusion on exam - Plan: DG Knee Complete 4 Views Left  Discussed possible causes including arthritis, cartilage derangement, Baker's cyst, weight, gout.  Discussed obtaining imaging.  Supportive care including compression, ice, heat, rest, NSAIDs.  Further recommendations based on imaging may include referral to Ortho.  Given Ace bandage in clinic.  Per chart review 2 attempts were made to contact patient regarding sleep study referral.  Patient to contact pulmonology, given info.  F/u as needed  Grier Mitts, MD

## 2021-02-19 ENCOUNTER — Other Ambulatory Visit: Payer: Self-pay | Admitting: Family Medicine

## 2021-02-19 ENCOUNTER — Ambulatory Visit: Payer: BC Managed Care – PPO | Admitting: Family Medicine

## 2021-02-19 ENCOUNTER — Other Ambulatory Visit: Payer: BC Managed Care – PPO

## 2021-02-19 DIAGNOSIS — Z13220 Encounter for screening for lipoid disorders: Secondary | ICD-10-CM | POA: Diagnosis not present

## 2021-02-19 LAB — LIPID PANEL
Cholesterol: 138 mg/dL (ref 0–200)
HDL: 55 mg/dL (ref >39.00)
LDL Cholesterol: 69 mg/dL (ref 0–99)
NonHDL: 83.39
Total CHOL/HDL Ratio: 3
Triglycerides: 70 mg/dL (ref 0.0–149.0)
VLDL: 14 mg/dL (ref 0.0–40.0)

## 2021-02-23 ENCOUNTER — Ambulatory Visit (INDEPENDENT_AMBULATORY_CARE_PROVIDER_SITE_OTHER): Payer: BC Managed Care – PPO

## 2021-02-23 ENCOUNTER — Other Ambulatory Visit: Payer: Self-pay

## 2021-02-23 ENCOUNTER — Other Ambulatory Visit: Payer: Self-pay | Admitting: Family Medicine

## 2021-02-23 DIAGNOSIS — R002 Palpitations: Secondary | ICD-10-CM

## 2021-02-23 NOTE — Progress Notes (Unsigned)
Enrolled for [employer] to mail a ZIO XT long term holter monitor to the patients address on file.  

## 2021-02-25 DIAGNOSIS — R002 Palpitations: Secondary | ICD-10-CM | POA: Diagnosis not present

## 2021-03-03 NOTE — Progress Notes (Signed)
Error

## 2021-03-05 DIAGNOSIS — R002 Palpitations: Secondary | ICD-10-CM | POA: Diagnosis not present

## 2021-03-09 ENCOUNTER — Ambulatory Visit: Payer: BC Managed Care – PPO | Admitting: Cardiovascular Disease

## 2021-03-09 ENCOUNTER — Other Ambulatory Visit: Payer: Self-pay

## 2021-03-09 VITALS — BP 128/80 | HR 79 | Ht 65.0 in | Wt 216.6 lb

## 2021-03-09 DIAGNOSIS — E6609 Other obesity due to excess calories: Secondary | ICD-10-CM

## 2021-03-09 DIAGNOSIS — I10 Essential (primary) hypertension: Secondary | ICD-10-CM | POA: Diagnosis not present

## 2021-03-09 DIAGNOSIS — R002 Palpitations: Secondary | ICD-10-CM | POA: Diagnosis not present

## 2021-03-09 DIAGNOSIS — G4733 Obstructive sleep apnea (adult) (pediatric): Secondary | ICD-10-CM

## 2021-03-09 DIAGNOSIS — R0683 Snoring: Secondary | ICD-10-CM | POA: Diagnosis not present

## 2021-03-09 DIAGNOSIS — Z6836 Body mass index (BMI) 36.0-36.9, adult: Secondary | ICD-10-CM

## 2021-03-09 NOTE — Patient Instructions (Signed)
Medication Instructions:  The current medical regimen is effective;  continue present plan and medications as directed. Please refer to the Current Medication list given to you today.   *If you need a refill on your cardiac medications before your next appointment, please call your pharmacy*  Lab Work:    NONE   Testing/Procedures:   Echocardiogram - Your physician has requested that you have an echocardiogram. Echocardiography is a painless test that uses sound waves to create images of your heart. It provides your doctor with information about the size and shape of your heart and how well your hearts chambers and valves are working. This procedure takes approximately one hour. There are no restrictions for this procedure. This will be performed at either our [facility] location - [address] [place] location [facility] 2nd floor.    Follow-Up: Your next appointment:  AS NEEDED   with Shelva Majestic, MD   At [facility], you and your health needs are our priority.  As part of our continuing mission to provide you with exceptional heart care, we have created designated Provider Care Teams.  These Care Teams include your primary Cardiologist (physician) and Advanced Practice Providers (APPs -  Physician Assistants and Nurse Practitioners) who all work together to provide you with the care you need, when you need it.
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
## 2021-03-11 ENCOUNTER — Encounter: Payer: Self-pay | Admitting: Pulmonary Disease

## 2021-03-11 ENCOUNTER — Ambulatory Visit (INDEPENDENT_AMBULATORY_CARE_PROVIDER_SITE_OTHER): Payer: BC Managed Care – PPO | Admitting: Pulmonary Disease

## 2021-03-11 ENCOUNTER — Other Ambulatory Visit: Payer: Self-pay

## 2021-03-11 VITALS — BP 122/78 | HR 71 | Temp 98.1°F | Ht 65.0 in | Wt 214.6 lb

## 2021-03-11 DIAGNOSIS — G478 Other sleep disorders: Secondary | ICD-10-CM | POA: Diagnosis not present

## 2021-03-11 NOTE — Progress Notes (Signed)
Emily Moss    [id number]    10-May-1980  Primary Care Physician:Banks, Langley Adie, MD  Referring Physician: Billie Ruddy, MD [address] [place],  [place] [address]  Chief complaint:   Patient being seen for concern for obstructive sleep apnea, nonrestorative sleep  HPI:  History of significant snoring, witnessed apneas Sleep feels nonrestorative Wakes up feeling tired on most days  Has been told about witnessed apneas Dryness of her mouth in the mornings No morning headaches Memory is good No sleepiness with driving  Usually goes to bed between 830 and 10:30 PM, takes about 5 to 10 minutes to fall asleep 1-2 awakenings Final wake up time about 6 AM  Weight is recently down about 15 pounds  She does have a 12-year-old sleeping in the same bed with her  History of hypertension, better controlled but at some point recently was more difficult to control  No family history of sleep apnea  History of hypertension, asthma in the past, gestational diabetes  Reformed smoker yes Outpatient Encounter Medications as of 03/11/2021  Medication Sig   losartan (COZAAR) 50 MG tablet Take 50 mg by mouth 2 (two) times daily.   [DISCONTINUED] ergocalciferol (VITAMIN D2) 1.25 MG (50000 UT) capsule ergocalciferol (vitamin D2) 1,250 mcg (50,000 unit) capsule   [DISCONTINUED] ferrous sulfate 325 (65 FE) MG tablet ferrous sulfate 325 mg (65 mg iron) tablet  take 1 tablet by mouth twice a day with food (Patient not taking: Reported on 03/11/2021)   No facility-administered encounter medications on file as of 03/11/2021.    Allergies as of 03/11/2021   (No Known Allergies)    Past Medical History:  Diagnosis Date   Abnormal Pap smear 2009   LAST PAP 05/2011   Asthma    CHILDHOOD   Diabetes mellitus    gestational- diet controlled   Eczema    Fibroid 2010   Gestational diabetes    H/O varicella    Headache(784.0)    one severe headache during pregnancy     Heart murmur    AT BIRTH RESOLVED   Heartburn in pregnancy    Infection    UTI X 1   Infection 2000, 2002,2008   CHLAMYDIA   Recurrent upper respiratory infection (URI)    RECURRENT BRONCHITIS    Past Surgical History:  Procedure Laterality Date   CESAREAN SECTION  01/20/2012   Procedure: CESAREAN SECTION;  Surgeon: Eldred Manges, MD;  Location: [facility];  Service: Obstetrics;  Laterality: N/A;   CESAREAN SECTION N/A 05/03/2017   Procedure: CESAREAN SECTION;  Surgeon: Christophe Louis, MD;  Location: [facility];  Service: Obstetrics;  Laterality: N/A;  EDD 05/31/17   NO PAST SURGERIES     WISDOM TOOTH EXTRACTION  2011   x 2    Family History  Problem Relation Age of Onset   Drug abuse Sister    Drug abuse Brother    Alcohol abuse Maternal Uncle    Alcohol abuse Maternal Grandfather    Alzheimer's disease Maternal Grandfather    Asthma Other    Cancer Cousin        LUNG/BREAST/LUNG   Stroke Cousin    Drug abuse Cousin    Diabetes Mother    Hypertension Mother    Cancer Mother 26       BREAST   Lupus Paternal Grandmother    Cancer Cousin        PANCREATIC;COLON  Social History   Socioeconomic History   Marital status: Married    Spouse name: LAVERNE HURSEY   Number of children: Not on file   Years of education: 50   Highest education level: Professional school degree (e.g., MD, DDS, DVM, JD)  Occupational History   Occupation: ATTORNEY  Tobacco Use   Smoking status: Former    Types: Pipe, Cigars    Quit date: 02/27/2011    Years since quitting: 10.0   Smokeless tobacco: Never  Substance and Sexual Activity   Alcohol use: No    Comment: [employer]   Drug use: No   Sexual activity: Yes    Partners: Male    Birth control/protection: OCP, None    Comment: currently pregnant  Other Topics Concern   Not on file  Social History Narrative   MOTHER WAS [place]   Social Determinants of Health   Financial Resource Strain: Low Risk     Difficulty of Paying Living Expenses: Not hard at all  Food Insecurity: No Food Insecurity   Worried About Charity fundraiser in the Last Year: Never true   [place] in the Last Year: Never true  Transportation Needs: No Transportation Needs   Lack of Transportation (Medical): No   Lack of Transportation (Non-Medical): No  Physical Activity: Insufficiently Active   Days of Exercise per Week: 3 days   Minutes of Exercise per Session: 30 min  Stress: No Stress Concern Present   Feeling of Stress : Not at all  Social Connections: Socially Integrated   Frequency of Communication with Friends and Family: More than three times a week   Frequency of Social Gatherings with Friends and Family: Twice a week   Attends Religious Services: More than 4 times per year   Active Member of [employer] or Organizations: Yes   Attends Music therapist: More than 4 times per year   Marital Status: Married  Human resources officer Violence: Not on file    Review of Systems  Constitutional:  Negative for fatigue.  Psychiatric/Behavioral:  Positive for sleep disturbance.    Vitals:   03/11/21 1007  BP: 122/78  Pulse: 71  Temp: 98.1 F (36.7 C)  SpO2: 99%     Physical Exam Constitutional:      Appearance: She is obese.  HENT:     Head: Normocephalic.     Mouth/Throat:     Mouth: Mucous membranes are moist.     Comments: Mallampati 2, crowded oropharynx Cardiovascular:     Rate and Rhythm: Normal rate and regular rhythm.     Heart sounds: No murmur heard.   No friction rub.  Pulmonary:     Effort: No respiratory distress.     Breath sounds: No stridor. No wheezing or rhonchi.  Musculoskeletal:     Cervical back: No rigidity or tenderness.  Neurological:     Mental Status: She is alert.   Results of the Epworth flowsheet 03/11/2021  Sitting and reading 0  Watching TV 1  Sitting, inactive in a public place (e.g. a theatre or a meeting) 0  As a passenger in a car for an hour  without a break 0  Lying down to rest in the afternoon when circumstances permit 2  Sitting and talking to someone 0  Sitting quietly after a lunch without alcohol 1  In a car, while stopped for a few minutes in traffic 0  Total score 4  Data Reviewed: No previous sleep study available  Assessment:  Moderate probability of significant obstructive sleep apnea  Obesity  Hypertension -Better controlled recently  Pathophysiology of sleep disordered breathing discussed with the patient  Treatment options for sleep disordered breathing discussed with the patient  Plan/Recommendations: Schedule patient for home sleep study  Tentative follow-up in 3 to 4 months  Graded exercises with focus on weight loss efforts  Risks of not treating sleep disordered breathing discussed with the patient  Encouraged to give [place] a call with any significant concerns   Sherrilyn Rist MD [facility] 03/11/2021, 10:32 AM  CC: Billie Ruddy, MD

## 2021-03-11 NOTE — Patient Instructions (Signed)
Schedule for home sleep study  Continue weight loss efforts  Regular exercise and diet  Call us with significant concerns  Tentative follow-up in 3 to 4 months  Sleep Apnea Sleep apnea affects breathing during sleep. It causes breathing to stop for 10 seconds or more, or to become shallow. People with sleep apnea usually snore loudly. It can also increase the risk of: Heart attack. Stroke. Being very overweight (obese). Diabetes. Heart failure. Irregular heartbeat. High blood pressure. The goal of treatment is to help you breathe normally again. What are the causes? The most common cause of this condition is a collapsed or blocked airway. There are three kinds of sleep apnea: Obstructive sleep apnea. This is caused by a blocked or collapsed airway. Central sleep apnea. This happens when the brain does not send the right signals to the muscles that control breathing. Mixed sleep apnea. This is a combination of obstructive and central sleep apnea. What increases the risk? Being overweight. Smoking. Having a small airway. Being older. Being female. Drinking alcohol. Taking medicines to calm yourself (sedatives or tranquilizers). Having family members with the condition. Having a tongue or tonsils that are larger than normal. What are the signs or symptoms? Trouble staying asleep. Loud snoring. Headaches in the morning. Waking up gasping. Dry mouth or sore throat in the morning. Being sleepy or tired during the day. If you are sleepy or tired during the day, you may also: Not be able to focus your mind (concentrate). Forget things. Get angry a lot and have mood swings. Feel sad (depressed). Have changes in your personality. Have less interest in sex, if you are female. Be unable to have an erection, if you are female. How is this treated?  Sleeping on your side. Using a medicine to get rid of mucus in your nose (decongestant). Avoiding the use of alcohol, medicines to  help you relax, or certain pain medicines (narcotics). Losing weight, if needed. Changing your diet. Quitting smoking. Using a machine to open your airway while you sleep, such as: An oral appliance. This is a mouthpiece that shifts your lower jaw forward. A CPAP device. This device blows air through a mask when you breathe out (exhale). An EPAP device. This has valves that you put in each nostril. A BIPAP device. This device blows air through a mask when you breathe in (inhale) and breathe out. Having surgery if other treatments do not work. Follow these instructions at home: Lifestyle Make changes that your doctor recommends. Eat a healthy diet. Lose weight if needed. Avoid alcohol, medicines to help you relax, and some pain medicines. Do not smoke or use any products that contain nicotine or tobacco. If you need help quitting, ask your doctor. General instructions Take over-the-counter and prescription medicines only as told by your doctor. If you were given a machine to use while you sleep, use it only as told by your doctor. If you are having surgery, make sure to tell your doctor you have sleep apnea. You may need to bring your device with you. Keep all follow-up visits. Contact a doctor if: The machine that you were given to use during sleep bothers you or does not seem to be working. You do not get better. You get worse. Get help right away if: Your chest hurts. You have trouble breathing in enough air. You have an uncomfortable feeling in your back, arms, or stomach. You have trouble talking. One side of your body feels weak. A part of your face  is hanging down. These symptoms may be an emergency. Get help right away. Call your local emergency services (911 in the [place] Do not wait to see if the symptoms will go away. Do not drive yourself to the hospital. Summary This condition affects breathing during sleep. The most common cause is a collapsed or blocked  airway. The goal of treatment is to help you breathe normally while you sleep. This information is not intended to replace advice given to you by your health care provider. Make sure you discuss any questions you have with your health care provider. Document Revised: 09/02/2020 Document Reviewed: 01/03/2020 Elsevier Patient Education  2022 [employer].

## 2021-03-16 ENCOUNTER — Encounter: Payer: Self-pay | Admitting: Cardiovascular Disease

## 2021-03-22 ENCOUNTER — Ambulatory Visit (HOSPITAL_COMMUNITY): Payer: BC Managed Care – PPO | Attending: Internal Medicine

## 2021-03-22 ENCOUNTER — Other Ambulatory Visit: Payer: Self-pay

## 2021-03-22 DIAGNOSIS — I10 Essential (primary) hypertension: Secondary | ICD-10-CM | POA: Insufficient documentation

## 2021-03-22 DIAGNOSIS — R002 Palpitations: Secondary | ICD-10-CM | POA: Insufficient documentation

## 2021-03-22 LAB — ECHOCARDIOGRAM COMPLETE
Area-P 1/2: 5.46 cm2
S' Lateral: 2.4 cm

## 2021-03-23 ENCOUNTER — Telehealth: Payer: Self-pay | Admitting: Cardiovascular Disease

## 2021-03-23 NOTE — Telephone Encounter (Addendum)
Spoke with patient who reports feeling light-headed. She also reports having  pressure in mid chest that does not hurt when she presses there, but feels pain go to her right shoulder area. She has tried losing wt and lost 3 pounds recently. Saturday morning, she woke from sleep with her heart pounding at 123. She has sleep study scheduled for May. I mentioned that her last glucose was 155. She stated she had gestational diabetes. She stated she will get her glucometer from her mother's house and check a fasting glucose in the morning. While talking, I had her eat two chocolate turtle candies. She started to feel better; light-headedness went away as did the chest pressure. I told pt to eat more complex carbs now so that her sugar won't drop. She will fix a sandwich. I told her I will check on her in an hour.

## 2021-03-23 NOTE — Telephone Encounter (Signed)
Pt c/o of Chest Pain: STAT if CP now or developed within 24 hours  1. Are you having CP right now? no  2. Are you experiencing any other symptoms (ex. SOB, nausea, vomiting, sweating)? Weakness, lightheaded, shoulder tightness  3. How long have you been experiencing CP? For about a weekend a half  4. Is your CP continuous or coming and going? Comes and goes  5. Have you taken Nitroglycerin? No  Patient states since she was last seen 1/31 she has been having increase in weakness and fatigue. She says when she gets the weakness she also gets shaky. She says she has also been getting dull chest aches and her HR has been "all over the place". She says saturday night she woke up with a racing HR and it was 120. She says she also gets a little lightheaded and has a tightness in her shoulder. She states her symptoms are worse at night or first thing in the morning.    ?

## 2021-03-23 NOTE — Telephone Encounter (Signed)
Called to check on pt. She is not feeling any better. Still feels weak. Tightness in lt neck/shoulder area. BP 135/88, P 78. Suggested heating pad and letting warm water hit her neck during shower. Had her to raise and lower her shoulders, which she said felt good. Encouraged her to do neck and should stretches while sitting to see if she gets any more relief.

## 2021-03-26 NOTE — Telephone Encounter (Signed)
agree

## 2021-03-29 ENCOUNTER — Encounter: Payer: Self-pay | Admitting: Family Medicine

## 2021-03-29 ENCOUNTER — Other Ambulatory Visit: Payer: Self-pay

## 2021-03-29 ENCOUNTER — Ambulatory Visit: Payer: BC Managed Care – PPO | Admitting: Family Medicine

## 2021-03-29 ENCOUNTER — Encounter (HOSPITAL_BASED_OUTPATIENT_CLINIC_OR_DEPARTMENT_OTHER): Payer: Self-pay

## 2021-03-29 ENCOUNTER — Emergency Department (HOSPITAL_BASED_OUTPATIENT_CLINIC_OR_DEPARTMENT_OTHER)
Admission: EM | Admit: 2021-03-29 | Discharge: 2021-03-30 | Disposition: A | Discharge status: Home / Self Care | Payer: BC Managed Care – PPO | Attending: Emergency Medicine | Admitting: Emergency Medicine

## 2021-03-29 VITALS — BP 114/76 | HR 77 | Temp 98.0°F | Resp 18 | Ht 65.0 in | Wt 212.2 lb

## 2021-03-29 DIAGNOSIS — R531 Weakness: Secondary | ICD-10-CM | POA: Diagnosis not present

## 2021-03-29 DIAGNOSIS — E119 Type 2 diabetes mellitus without complications: Secondary | ICD-10-CM | POA: Insufficient documentation

## 2021-03-29 DIAGNOSIS — R42 Dizziness and giddiness: Secondary | ICD-10-CM | POA: Diagnosis not present

## 2021-03-29 DIAGNOSIS — J45909 Unspecified asthma, uncomplicated: Secondary | ICD-10-CM | POA: Insufficient documentation

## 2021-03-29 DIAGNOSIS — R0789 Other chest pain: Secondary | ICD-10-CM | POA: Insufficient documentation

## 2021-03-29 DIAGNOSIS — Z20822 Contact with and (suspected) exposure to covid-19: Secondary | ICD-10-CM | POA: Diagnosis not present

## 2021-03-29 DIAGNOSIS — R6883 Chills (without fever): Secondary | ICD-10-CM | POA: Insufficient documentation

## 2021-03-29 DIAGNOSIS — I499 Cardiac arrhythmia, unspecified: Secondary | ICD-10-CM | POA: Diagnosis not present

## 2021-03-29 DIAGNOSIS — I10 Essential (primary) hypertension: Secondary | ICD-10-CM

## 2021-03-29 DIAGNOSIS — R079 Chest pain, unspecified: Secondary | ICD-10-CM

## 2021-03-29 LAB — COMPREHENSIVE METABOLIC PANEL
ALT: 6 U/L (ref 0–35)
AST: 15 U/L (ref 0–37)
Albumin: 4.4 g/dL (ref 3.5–5.2)
Alkaline Phosphatase: 45 U/L (ref 39–117)
BUN: 16 mg/dL (ref 6–23)
CO2: 31 mEq/L (ref 19–32)
Calcium: 9.5 mg/dL (ref 8.4–10.5)
Chloride: 102 mEq/L (ref 96–112)
Creatinine, Ser: 0.76 mg/dL (ref 0.40–1.20)
GFR: 98.63 mL/min (ref >60.00)
Glucose, Bld: 85 mg/dL (ref 70–99)
Potassium: 3.9 mEq/L (ref 3.5–5.1)
Sodium: 137 mEq/L (ref 135–145)
Total Bilirubin: 0.4 mg/dL (ref 0.2–1.2)
Total Protein: 7.8 g/dL (ref 6.0–8.3)

## 2021-03-29 LAB — TSH: TSH: 1.13 u[IU]/mL (ref 0.35–5.50)

## 2021-03-29 LAB — MAGNESIUM: Magnesium: 1.9 mg/dL (ref 1.5–2.5)

## 2021-03-29 LAB — T4, FREE: Free T4: 0.93 ng/dL (ref 0.60–1.60)

## 2021-03-29 NOTE — ED Triage Notes (Signed)
Weakness starting around 5 pm today with chills about an hour prior to arrival.  Also states she is having fullness and discomfort in her chest intermittently for the last three weeks.  Has been seen by PCP and cardiologist for the chest discomfort today.

## 2021-03-29 NOTE — Progress Notes (Signed)
Subjective:    Patient ID: Emily Moss, female    DOB: 1980-12-11, 41 y.o.   MRN: [id number]  Chief Complaint  Patient presents with   Dizziness   Fatigue    Pt states that these problems come and goes.     HPI Patient was seen today for ongoing concern.  Patient endorses episodes of dizziness, fatigue, shakiness, tachycardia and bradycardia x3 weeks.  Patient states BP typically normal during these times, the highest has been 130s/80s.  Symptoms can occur anytime.  Patient was awakened from sleep 2/2 heart racing.  Per her Apple Watch heart rate at that time was 123.  Heart rate has been in the 170s during a Zumba class.  Took a while to return to normal.  Patient was seen by cardiology.  He wore Zio patch x3 days.  Echo largely normal with EF 60-65%, and mild mitral regurg.  Patient remembers something about Wenkebach being noted on EKG.  Pt concerned that the episodes are happening more and she feels bad during them.  Pt denies increased caffeine intake, stress, changes in medications or supplements.  Patient feels frustrated in regards to finding out what is going on with her health.  Interested in second opinion.  Past Medical History:  Diagnosis Date   Abnormal Pap smear 2009   LAST PAP 05/2011   Asthma    CHILDHOOD   Diabetes mellitus    gestational- diet controlled   Eczema    Fibroid 2010   Gestational diabetes    H/O varicella    Headache(784.0)    one severe headache during pregnancy    Heart murmur    AT BIRTH RESOLVED   Heartburn in pregnancy    Infection    UTI X 1   Infection 2000, 2002,2008   CHLAMYDIA   Recurrent upper respiratory infection (URI)    RECURRENT BRONCHITIS    No Known Allergies  ROS General: Denies fever, chills, night sweats, changes in weight, changes in appetite + lightheadedness, fatigue HEENT: Denies headaches, ear pain, changes in vision, rhinorrhea, sore throat CV: Denies CP, palpitations, SOB, orthopnea +CP, tachycardia,  bradycardia Pulm: Denies SOB, cough, wheezing GI: Denies abdominal pain, nausea, vomiting, diarrhea, constipation GU: Denies dysuria, hematuria, frequency, vaginal discharge Msk: Denies muscle cramps, joint pains Neuro: Denies weakness, numbness, tingling Skin: Denies rashes, bruising Psych: Denies depression, anxiety, hallucinations  Objective:    Blood pressure 114/76, pulse 77, temperature 98 F (36.7 C), resp. rate 18, height 5\' 5"  (1.651 m), weight 212 lb 3.2 oz (96.3 kg), SpO2 93 %.  Gen. Pleasant, well-nourished, in no distress, normal affect   HEENT: /AT, face symmetric, conjunctiva clear, no scleral icterus, PERRLA, EOMI, nares patent without drainage Lungs: no accessory muscle use, CTAB, no wheezes or rales Cardiovascular: RRR, no m/r/g, no peripheral edema Musculoskeletal: No deformities, no cyanosis or clubbing, normal tone Neuro:  A&Ox3, CN II-XII intact, normal gait Skin:  Warm, no lesions/ rash   Wt Readings from Last 3 Encounters:  03/29/21 212 lb 3.2 oz (96.3 kg)  03/11/21 214 lb 9.6 oz (97.3 kg)  03/09/21 216 lb 9.6 oz (98.2 kg)    Lab Results  Component Value Date   WBC 5.9 11/01/2020   HGB 13.1 11/01/2020   HCT 38.7 11/01/2020   PLT 319 11/01/2020   GLUCOSE 85 03/29/2021   CHOL 138 02/19/2021   TRIG 70.0 02/19/2021   HDL 55.00 02/19/2021   LDLCALC 69 02/19/2021   ALT 6 03/29/2021   AST 15  03/29/2021   NA 137 03/29/2021   K 3.9 03/29/2021   CL 102 03/29/2021   CREATININE 0.76 03/29/2021   BUN 16 03/29/2021   CO2 31 03/29/2021   TSH 1.13 03/29/2021    Assessment/Plan:  Dizziness  -Echo with EF 60-65% with mild MR on 03/22/2021 -Concerned arrhythmia may be causing symptoms. -We will likely benefit from Holter monitor worn for an extended period of time -Obtain orthostatic BPs - Plan: EKG 12-Lead, Ambulatory referral to Cardiology, TSH, T4, Free, CMP, Magnesium  Chest pain, unspecified type  -Likely 2/2 palpitations versus arrhythmia such  as symptomatic second-degree heart block. -6-minute walk test normal and did not reproduce symptoms - Plan: EKG 12-Lead, Ambulatory referral to Cardiology, TSH, T4, Free, CMP, Magnesium  Cardiac arrhythmia, unspecified cardiac arrhythmia type -Per patient Emily Moss mentioned at a previous [facility]. -Concern patient may be experiencing episodes of symptomatic second-degree heart block given fatigue, chest pain, intermittent tachycardia, lightheadedness, and shaky sensation. -EKG this visit with NSR, HR 60, PR interval 0.17.  Nonspecific ST elevation in V2.  Prior EKGs reviewed for comparison.  - Plan: Ambulatory referral to Cardiology, TSH, T4, Free, CMP, Magnesium  Essential hypertension  -Controlled -Continue historic medication losartan 50 mg twice daily -In the past HCTZ and losartan together caused unwell feeling/fatigue, hypokalemia, and did not control BP. - Plan: Ambulatory referral to Cardiology, TSH, T4, Free, CMP  F/u prn  Update: Labs ordered this visit normal.  More than 50% of over 45-50 minutes spent in total in caring for this patient was spent face-to-face, reviewing the chart, counseling and/or coordinating care.    Grier Mitts, MD

## 2021-03-30 LAB — URINALYSIS, ROUTINE W REFLEX MICROSCOPIC
Bilirubin Urine: NEGATIVE
Glucose, UA: NEGATIVE mg/dL
Hgb urine dipstick: NEGATIVE
Ketones, ur: NEGATIVE mg/dL
Leukocytes,Ua: NEGATIVE
Nitrite: NEGATIVE
Protein, ur: NEGATIVE mg/dL
Specific Gravity, Urine: 1.016 (ref 1.005–1.030)
pH: 6.5 (ref 5.0–8.0)

## 2021-03-30 LAB — RESP PANEL BY RT-PCR (FLU A&B, COVID) ARPGX2
Influenza A by PCR: NEGATIVE
Influenza B by PCR: NEGATIVE
SARS Coronavirus 2 by RT PCR: NEGATIVE

## 2021-03-30 LAB — PREGNANCY, URINE: Preg Test, Ur: NEGATIVE

## 2021-03-30 NOTE — ED Provider Notes (Signed)
[facility] Provider Note: Emily Spurling, MD, FACEP  CSN: [id number] MRN: [id number] ARRIVAL: 03/29/21 at 2335 ROOM: [place]  Weakness   HISTORY OF PRESENT ILLNESS  03/30/21 12:15 AM Emily Moss is a 41 y.o. female who started having generalized weakness about 5 PM yesterday evening with chills about an hour prior to arrival.  She has not had a fever, nasal congestion, sore throat, cough, shortness of breath, nausea, vomiting, diarrhea or dysuria.  She has been having some discomfort and a sense of fullness in her chest recently for the past 3 weeks.  She has been seen by her PCP for this and was seen by cardiology yesterday.  Her lab work from the clinic was normal:  Results for orders placed or performed in visit on 03/29/21 (from the past 24 hour(s))  TSH     Status: None   Collection Time: 03/29/21  2:42 PM  Result Value Ref Range   TSH 1.13 0.35 - 5.50 uIU/mL  T4, Free     Status: None   Collection Time: 03/29/21  2:42 PM  Result Value Ref Range   Free T4 0.93 0.60 - 1.60 ng/dL  CMP     Status: None   Collection Time: 03/29/21  2:42 PM  Result Value Ref Range   Sodium 137 135 - 145 mEq/L   Potassium 3.9 3.5 - 5.1 mEq/L   Chloride 102 96 - 112 mEq/L   CO2 31 19 - 32 mEq/L   Glucose, Bld 85 70 - 99 mg/dL   BUN 16 6 - 23 mg/dL   Creatinine, Ser 0.76 0.40 - 1.20 mg/dL   Total Bilirubin 0.4 0.2 - 1.2 mg/dL   Alkaline Phosphatase 45 39 - 117 U/L   AST 15 0 - 37 U/L   ALT 6 0 - 35 U/L   Total Protein 7.8 6.0 - 8.3 g/dL   Albumin 4.4 3.5 - 5.2 g/dL   GFR 98.63 >60.00 mL/min   Calcium 9.5 8.4 - 10.5 mg/dL  Magnesium     Status: None   Collection Time: 03/29/21  2:42 PM  Result Value Ref Range   Magnesium 1.9 1.5 - 2.5 mg/dL   Her EKG on arrival is normal.   Past Medical History:  Diagnosis Date   Abnormal Pap smear 2009   LAST PAP 05/2011   Asthma    CHILDHOOD   Diabetes mellitus    gestational- diet controlled   Eczema     Fibroid 2010   Gestational diabetes    H/O varicella    Headache(784.0)    one severe headache during pregnancy    Heart murmur    AT BIRTH RESOLVED   Heartburn in pregnancy    Infection    UTI X 1   Infection 2000, 2002,2008   CHLAMYDIA   Recurrent upper respiratory infection (URI)    RECURRENT BRONCHITIS    Past Surgical History:  Procedure Laterality Date   CESAREAN SECTION  01/20/2012   Procedure: CESAREAN SECTION;  Surgeon: Eldred Manges, MD;  Location: [facility];  Service: Obstetrics;  Laterality: N/A;   CESAREAN SECTION N/A 05/03/2017   Procedure: CESAREAN SECTION;  Surgeon: Christophe Louis, MD;  Location: [facility];  Service: Obstetrics;  Laterality: N/A;  EDD 05/31/17   NO PAST SURGERIES     WISDOM TOOTH EXTRACTION  2011   x 2    Family History  Problem Relation Age of Onset   Drug abuse Sister  Drug abuse Brother    Alcohol abuse Maternal Uncle    Alcohol abuse Maternal Grandfather    Alzheimer's disease Maternal Grandfather    Asthma Other    Cancer Cousin        LUNG/BREAST/LUNG   Stroke Cousin    Drug abuse Cousin    Diabetes Mother    Hypertension Mother    Cancer Mother 44       BREAST   Lupus Paternal Grandmother    Cancer Cousin        PANCREATIC;COLON    Social History   Tobacco Use   Smoking status: Never   Smokeless tobacco: Never  Substance Use Topics   Alcohol use: No   Drug use: No    Prior to Admission medications   Medication Sig Start Date End Date Taking? Authorizing Provider  losartan (COZAAR) 50 MG tablet Take 50 mg by mouth 2 (two) times daily. 12/17/20   [provider]    Allergies Patient has no known allergies.   REVIEW OF SYSTEMS  Negative except as noted here or in the History of Present Illness.   PHYSICAL EXAMINATION  Initial Vital Signs Blood pressure (!) 135/96, pulse 68, temperature 98.1 F (36.7 C), temperature source Oral, resp. rate 16, height 5\' 5"  (1.651 m), weight 96.2 kg, last  menstrual period 03/27/2021, SpO2 100 %.  Examination General: Well-developed, well-nourished female in no acute distress; appearance consistent with age of record HENT: normocephalic; atraumatic Eyes: pupils equal, round and reactive to light; extraocular muscles intact Neck: supple Heart: regular rate and rhythm Lungs: clear to auscultation bilaterally Abdomen: soft; nondistended; nontender; bowel sounds present Extremities: No deformity; full range of motion; pulses normal Neurologic: Awake, alert and oriented; motor function intact in all extremities and symmetric; no facial droop Skin: Warm and dry Psychiatric: Normal mood and affect   RESULTS  Summary of this visit's results, reviewed and interpreted by myself:   EKG Interpretation  Date/Time:  Tuesday March 30 2021 00:03:02 EST Ventricular Rate:  60 PR Interval:  183 QRS Duration: 101 QT Interval:  425 QTC Calculation: 425 R Axis:   60 Text Interpretation: Sinus rhythm Normal ECG No significant change was found Confirmed by Shanon Rosser [phone]) on 03/30/2021 12:15:00 AM       Laboratory Studies: Results for orders placed or performed during the hospital encounter of 03/29/21 (from the past 24 hour(s))  Resp Panel by RT-PCR (Flu A&B, Covid) Nasopharyngeal Swab     Status: None   Collection Time: 03/30/21 12:04 AM   Specimen: Nasopharyngeal Swab; Nasopharyngeal(NP) swabs in vial transport medium  Result Value Ref Range   SARS Coronavirus 2 by RT PCR NEGATIVE NEGATIVE   Influenza A by PCR NEGATIVE NEGATIVE   Influenza B by PCR NEGATIVE NEGATIVE  Urinalysis, Routine w reflex microscopic     Status: None   Collection Time: 03/30/21 12:18 AM  Result Value Ref Range   Color, Urine YELLOW YELLOW   APPearance CLEAR CLEAR   Specific Gravity, Urine 1.016 1.005 - 1.030   pH 6.5 5.0 - 8.0   Glucose, UA NEGATIVE NEGATIVE mg/dL   Hgb urine dipstick NEGATIVE NEGATIVE   Bilirubin Urine NEGATIVE NEGATIVE   Ketones, ur  NEGATIVE NEGATIVE mg/dL   Protein, ur NEGATIVE NEGATIVE mg/dL   Nitrite NEGATIVE NEGATIVE   Leukocytes,Ua NEGATIVE NEGATIVE  Pregnancy, urine     Status: None   Collection Time: 03/30/21 12:18 AM  Result Value Ref Range   Preg Test, Ur NEGATIVE NEGATIVE  Imaging Studies: No results found.  ED COURSE and MDM  Nursing notes, initial and subsequent vitals signs, including pulse oximetry, reviewed and interpreted by myself.  Vitals:   03/29/21 2352 03/29/21 2353 03/30/21 0021 03/30/21 0022  BP: (!) 135/96  134/83   Pulse: 68  67 69  Resp: 16  14 11   Temp: 98.1 F (36.7 C)     TempSrc: Oral     SpO2: 100%  100% 100%  Weight:  96.2 kg    Height:  5\' 5"  (7.939 m)     Medications - No data to display  The cause of the patient's acute illness is unclear.  She does not have a urinary tract infection.  She is negative for COVID and influenza.  She could have a viral illness for which we have not tested.  Her more chronic condition is also unclear.  Her laboratory studies from the cardiologist yesterday are reassuring as well as her EKG.  She was advised to return to the ED for worsening or changing symptoms.  PROCEDURES  Procedures   ED DIAGNOSES     ICD-10-CM   1. Chills without fever  R68.83     2. Generalized weakness  R53.1          Kiandre Spagnolo, Jenny Reichmann, MD 03/30/21 [phone]

## 2021-03-31 ENCOUNTER — Telehealth: Payer: Self-pay | Admitting: Cardiovascular Disease

## 2021-03-31 NOTE — Telephone Encounter (Signed)
Urgent referral came in for the patient to see Dr. Oval Linsey. Patient would like to start seeing Dr. Oval Linsey

## 2021-04-01 NOTE — Telephone Encounter (Signed)
Okay by me.

## 2021-04-02 ENCOUNTER — Telehealth: Payer: Self-pay | Admitting: Family Medicine

## 2021-04-02 NOTE — Telephone Encounter (Signed)
Patient dropped paperwork off to be completed by Dr.Banks. Paperwork placed in file to be completed      Please advise

## 2021-04-02 NOTE — Telephone Encounter (Signed)
Patient called to see if Dr. Oval Linsey approved her provider switch or not.

## 2021-04-07 DIAGNOSIS — F4323 Adjustment disorder with mixed anxiety and depressed mood: Secondary | ICD-10-CM | POA: Diagnosis not present

## 2021-04-07 NOTE — Telephone Encounter (Signed)
Pt is need to see a provider very soon and could not wait til 5 to see Dr Oval Linsey ? ?Dr Marlou Porch would you be willing to take the pt as a provider switch from [place]?   ?

## 2021-04-09 NOTE — Telephone Encounter (Signed)
Forms completed and faxed, fax confirmation rec'd. Sent pt a message on MyChart letting her know it was completed and faxed. ?

## 2021-04-12 NOTE — Telephone Encounter (Signed)
Patient called to check status of provider switch from Dr. Claiborne Billings to Dr. Marlou Porch  ?

## 2021-04-13 NOTE — Telephone Encounter (Signed)
Patient following up, very anxious. Again requesting to switch to Dr. Marlou Porch.  ? ?Please advise as able,  ?Thanks  ?

## 2021-04-15 ENCOUNTER — Telehealth: Payer: Self-pay | Admitting: Family Medicine

## 2021-04-15 NOTE — Telephone Encounter (Signed)
Patient requesting to switch to Dr. Harriet Masson. ?

## 2021-04-15 NOTE — Telephone Encounter (Signed)
Pt is calling and has been switch to different  cardiologist providers and would like dr banks to call her  ?

## 2021-04-16 NOTE — Telephone Encounter (Signed)
Spoke with pt, wanted to let PCP know about the referral and how she was not seeing Dr Oval Linsey, but now Dr Harriet Masson. PCP is aware. ?

## 2021-04-16 NOTE — Telephone Encounter (Signed)
That will be fine with me. 

## 2021-04-19 ENCOUNTER — Other Ambulatory Visit: Payer: Self-pay

## 2021-04-19 ENCOUNTER — Encounter: Payer: Self-pay | Admitting: Cardiology

## 2021-04-19 ENCOUNTER — Ambulatory Visit: Payer: BC Managed Care – PPO | Admitting: Cardiology

## 2021-04-19 VITALS — BP 124/88 | HR 84 | Ht 65.0 in | Wt 214.4 lb

## 2021-04-19 DIAGNOSIS — R5383 Other fatigue: Secondary | ICD-10-CM

## 2021-04-19 DIAGNOSIS — Z79899 Other long term (current) drug therapy: Secondary | ICD-10-CM | POA: Diagnosis not present

## 2021-04-19 DIAGNOSIS — R079 Chest pain, unspecified: Secondary | ICD-10-CM | POA: Diagnosis not present

## 2021-04-19 DIAGNOSIS — R7303 Prediabetes: Secondary | ICD-10-CM | POA: Diagnosis not present

## 2021-04-19 DIAGNOSIS — I10 Essential (primary) hypertension: Secondary | ICD-10-CM

## 2021-04-19 DIAGNOSIS — Z6836 Body mass index (BMI) 36.0-36.9, adult: Secondary | ICD-10-CM

## 2021-04-19 DIAGNOSIS — E6609 Other obesity due to excess calories: Secondary | ICD-10-CM

## 2021-04-19 MED ORDER — METOPROLOL TARTRATE 100 MG PO TABS: ORAL_TABLET | ORAL | 0 refills | Status: DC

## 2021-04-19 NOTE — Patient Instructions (Addendum)
Medication Instructions:  ?Your physician recommends that you continue on your current medications as directed. Please refer to the Current Medication list given to you today.  ?*If you need a refill on your cardiac medications before your next appointment, please call your pharmacy* ? ? ?Lab Work: ?Your physician recommends that you return for lab work in:  ?TODAY: Vitamin d, Vitamin B12, BMET, Mag ?If you have labs (blood work) drawn today and your tests are completely normal, you will receive your results only by: ?MyChart Message (if you have MyChart) OR ?A paper copy in the mail ?If you have any lab test that is abnormal or we need to change your treatment, we will call you to review the results. ? ? ?Testing/Procedures: ? ? ?Your cardiac CT will be scheduled at one of the below locations:  ? ?[facility] ?[address] ?[place], [place] [address] ?[phone] ? ?If scheduled at [facility], please arrive at the [facility] and Children's Entrance (Entrance C2) of [facility] 30 minutes prior to test start time. ?You can use the FREE valet parking offered at entrance C (encouraged to control the heart rate for the test)  ?Proceed to the [facility] Radiology Department (first floor) to check-in and test prep. ? ?All radiology patients and guests should use entrance C2 at [facility], accessed from [facility], even though the hospital's physical address listed is [address]. ? ? ? ? ?Please follow these instructions carefully (unless otherwise directed): ? ? ?On the Night Before the Test: ?Be sure to Drink plenty of water. ?Do not consume any caffeinated/decaffeinated beverages or chocolate 12 hours prior to your test. ?Do not take any antihistamines 12 hours prior to your test. ? ?On the Day of the Test: ?Drink plenty of water until 1 hour prior to the test. ?Do not eat any food 4 hours prior to the test. ?You may take your regular medications prior to  the test.  ?Take metoprolol (Lopressor) two hours prior to test. ?HOLD Losartan day of the test. ?FEMALES- please wear underwire-free bra if available, avoid dresses & tight clothing ? ?     ?After the Test: ?Drink plenty of water. ?After receiving IV contrast, you may experience a mild flushed feeling. This is normal. ?On occasion, you may experience a mild rash up to 24 hours after the test. This is not dangerous. If this occurs, you can take Benadryl 25 mg and increase your fluid intake. ?If you experience trouble breathing, this can be serious. If it is severe call 911 IMMEDIATELY. If it is mild, please call our office. ?If you take any of these medications: Glipizide/Metformin, Avandament, Glucavance, please do not take 48 hours after completing test unless otherwise instructed. ? ?We will call to schedule your test 2-4 weeks out understanding that some insurance companies will need an authorization prior to the service being performed.  ? ?For non-scheduling related questions, please contact the cardiac imaging nurse navigator should you have any questions/concerns: ?Marchia Bond, Cardiac Imaging Nurse Navigator ?Gordy Clement, Cardiac Imaging Nurse Navigator ?[facility] ?Direct Office Dial: [phone]  ? ?For scheduling needs, including cancellations and rescheduling, please call [place], [phone]. ? ? ? ?Follow-Up: ?At [facility], you and your health needs are our priority.  As part of our continuing mission to provide you with exceptional heart care, we have created designated Provider Care Teams.  These Care Teams include your primary Cardiologist (physician) and Advanced  Practice Providers (APPs -  Physician Assistants and Nurse Practitioners) who all work together to provide you with the care you need, when you need it. ? ?We recommend signing up for the patient portal called "MyChart".  Sign up information is provided on this After Visit Summary.  MyChart is used  to connect with patients for Virtual Visits (Telemedicine).  Patients are able to view lab/test results, encounter notes, upcoming appointments, etc.  Non-urgent messages can be sent to your provider as well.   ?To learn more about what you can do with MyChart, go to [email or web address].   ? ?Your next appointment:   ?12 week(s) ? ?The format for your next appointment:   ?In Person ? ?Provider:   ?Berniece Salines, DO   ? ? ?Other Instructions ?  ?
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
## 2021-04-20 ENCOUNTER — Encounter: Payer: Self-pay | Admitting: Cardiology

## 2021-04-20 DIAGNOSIS — F4323 Adjustment disorder with mixed anxiety and depressed mood: Secondary | ICD-10-CM | POA: Diagnosis not present

## 2021-04-20 LAB — BASIC METABOLIC PANEL
BUN/Creatinine Ratio: 17 (ref 9–23)
BUN: 13 mg/dL (ref 6–20)
CO2: 23 mmol/L (ref 20–29)
Calcium: 9.6 mg/dL (ref 8.7–10.2)
Chloride: 103 mmol/L (ref 96–106)
Creatinine, Ser: 0.78 mg/dL (ref 0.57–1.00)
Glucose: 107 mg/dL — ABNORMAL HIGH (ref 70–99)
Potassium: 4.7 mmol/L (ref 3.5–5.2)
Sodium: 141 mmol/L (ref 134–144)
eGFR: 99 mL/min/{1.73_m2} (ref >59)

## 2021-04-20 LAB — VITAMIN D 25 HYDROXY (VIT D DEFICIENCY, FRACTURES): Vit D, 25-Hydroxy: 15.1 ng/mL — ABNORMAL LOW (ref 30.0–100.0)

## 2021-04-20 LAB — MAGNESIUM: Magnesium: 1.9 mg/dL (ref 1.6–2.3)

## 2021-04-20 LAB — VITAMIN B12: Vitamin B-12: 397 pg/mL (ref 232–1245)

## 2021-04-21 ENCOUNTER — Other Ambulatory Visit: Payer: Self-pay

## 2021-04-21 MED ORDER — VITAMIN D (ERGOCALCIFEROL) 1.25 MG (50000 UNIT) PO CAPS: 50000.0000 [IU] | ORAL_CAPSULE | ORAL | 0 refills | Status: DC

## 2021-04-21 NOTE — Progress Notes (Signed)
Prescription sent to pharmacy.

## 2021-04-26 ENCOUNTER — Telehealth (HOSPITAL_COMMUNITY): Payer: Self-pay | Admitting: Emergency Medicine

## 2021-04-26 NOTE — Telephone Encounter (Signed)
Reaching out to patient to offer assistance regarding upcoming cardiac imaging study; pt verbalizes understanding of appt date/time, parking situation and where to check in, pre-test NPO status and medications ordered, and verified current allergies; name and call back number provided for further questions should they arise ?Marchia Bond RN Navigator Cardiac Imaging ?[facility] ?[phone] office ?[phone] cell ? ?Nervous ?Denies iv issues ?'100mg'$  metoprolol tart ?Arrival 200 ?

## 2021-04-28 ENCOUNTER — Ambulatory Visit (HOSPITAL_COMMUNITY)
Admission: RE | Admit: 2021-04-28 | Discharge: 2021-04-28 | Disposition: A | Discharge status: Home / Self Care | Payer: BC Managed Care – PPO | Source: Ambulatory Visit | Attending: Cardiology | Admitting: Cardiology

## 2021-04-28 ENCOUNTER — Encounter (HOSPITAL_COMMUNITY): Payer: Self-pay

## 2021-04-28 ENCOUNTER — Other Ambulatory Visit: Payer: Self-pay

## 2021-04-28 DIAGNOSIS — R079 Chest pain, unspecified: Secondary | ICD-10-CM | POA: Diagnosis not present

## 2021-04-28 MED ORDER — NITROGLYCERIN 0.4 MG SL SUBL
0.8000 mg | SUBLINGUAL_TABLET | Freq: Once | SUBLINGUAL | Status: AC
Start: 2021-04-28 — End: 2021-04-28
  Administered 2021-04-28: 0.8 mg via SUBLINGUAL

## 2021-04-28 MED ORDER — METOPROLOL TARTRATE 5 MG/5ML IV SOLN: 5.0000 mg | INTRAVENOUS | Status: DC | PRN

## 2021-04-28 MED ORDER — METOPROLOL TARTRATE 5 MG/5ML IV SOLN
INTRAVENOUS | Status: AC
  Administered 2021-04-28: 5 mg via INTRAVENOUS
  Filled 2021-04-28: qty 5

## 2021-04-28 MED ORDER — IOHEXOL 350 MG/ML SOLN
100.0000 mL | Freq: Once | INTRAVENOUS | Status: AC | PRN
  Administered 2021-04-28: 100 mL via INTRAVENOUS

## 2021-04-28 MED ORDER — NITROGLYCERIN 0.4 MG SL SUBL
SUBLINGUAL_TABLET | SUBLINGUAL | Status: AC
  Filled 2021-04-28: qty 2

## 2021-04-29 ENCOUNTER — Encounter: Payer: Self-pay | Admitting: Cardiology

## 2021-05-03 ENCOUNTER — Ambulatory Visit: Payer: BC Managed Care – PPO | Admitting: Family Medicine

## 2021-05-03 ENCOUNTER — Encounter: Payer: Self-pay | Admitting: Family Medicine

## 2021-05-03 VITALS — BP 98/77 | HR 71 | Temp 98.2°F | Wt 212.4 lb

## 2021-05-03 DIAGNOSIS — E559 Vitamin D deficiency, unspecified: Secondary | ICD-10-CM | POA: Diagnosis not present

## 2021-05-03 DIAGNOSIS — I10 Essential (primary) hypertension: Secondary | ICD-10-CM | POA: Diagnosis not present

## 2021-05-03 DIAGNOSIS — R079 Chest pain, unspecified: Secondary | ICD-10-CM | POA: Diagnosis not present

## 2021-05-03 MED ORDER — AMLODIPINE BESYLATE 5 MG PO TABS: 5.0000 mg | ORAL_TABLET | Freq: Every day | ORAL | 1 refills | Status: DC

## 2021-05-03 NOTE — Progress Notes (Signed)
Subjective:  ? ? Patient ID: TAMEYAH KOCH, female    DOB: 05/21/1980, 41 y.o.   MRN: [id number] ? ?Chief Complaint  ?Patient presents with  ? Follow-up  ?  Dizziness, BP, chest pain. Low heart rate.  ?Went to funeral Saturday and just felt bad, tightness in upper chest, and neck area. Went to a conferrence and pulse was eighty something %   ? ? ?HPI ?Patient was seen today for follow-up. Pt endorses continued CP described as a tightness/soreness in upper chest upper back, and posterior neck.  This sensation causes  anxiety.  Pt is in therapy.  Had f/u with Cardiology, Dr. Harriet Masson.  CT cardiac scoring nml.  Vitamin D def noted, ergocalciferol 50,000 IU weekly x12 weeks started.  Pt notes her pO2 on apple watch seems to decrease around 6-7 am and 5-8 pm prior to taking bp med.  Pt also notes feeling bad while at a funeral on Saturday.  During the time pt was bradycardic, HR 42 and 48 per apple watch.  This am pt's bp at home was 104/67, before taking losartan 50 mg. ? ?Sleep study scheduled in the next month. ? ?Past Medical History:  ?Diagnosis Date  ? Abnormal Pap smear 2009  ? LAST PAP 05/2011  ? Asthma   ? CHILDHOOD  ? Diabetes mellitus   ? gestational- diet controlled  ? Eczema   ? Fibroid 2010  ? Gestational diabetes   ? H/O varicella   ? Headache(784.0)   ? one severe headache during pregnancy   ? Heart murmur   ? AT BIRTH RESOLVED  ? Heartburn in pregnancy   ? Infection   ? UTI X 1  ? Infection 2000, [id number]  ? CHLAMYDIA  ? Recurrent upper respiratory infection (URI)   ? RECURRENT BRONCHITIS  ? ? ?No Known Allergies ? ?ROS ?General: Denies fever, chills, night sweats, changes in weight, changes in appetite ?HEENT: Denies headaches, ear pain, changes in vision, rhinorrhea, sore throat ?CV: Denies CP, palpitations, SOB, orthopnea +bradycardia ?Pulm: Denies SOB, cough, wheezing ?GI: Denies abdominal pain, nausea, vomiting, diarrhea, constipation ?GU: Denies dysuria, hematuria, frequency, vaginal discharge ?Msk:  Denies muscle cramps, joint pains +chest tightness/soreness into upper back and neck. ?Neuro: Denies weakness, numbness, tingling ?Skin: Denies rashes, bruising ?Psych: Denies depression, hallucinations +anxiety ? ?   ?Objective:  ?  ?Blood pressure 98/77, pulse 71, temperature 98.2 ?F (36.8 ?C), temperature source Oral, weight 212 lb 6.4 oz (96.3 kg), SpO2 100 %. ? ?Gen. Pleasant, well-nourished, in no distress, normal affect   ?HEENT: [place]/AT, face symmetric, conjunctiva clear, no scleral icterus, PERRLA, EOMI, nares patent without drainage ?Lungs: no accessory muscle use, CTAB, no wheezes or rales ?Cardiovascular: RRR, no m/r/g, no peripheral edema ?Musculoskeletal: No deformities, no cyanosis or clubbing, normal tone ?Neuro:  A&Ox3, CN II-XII intact, normal gait ?Skin:  Warm, no lesions/ rash ? ? ?Wt Readings from Last 3 Encounters:  ?05/03/21 212 lb 6.4 oz (96.3 kg)  ?04/19/21 214 lb 6.4 oz (97.3 kg)  ?03/29/21 212 lb (96.2 kg)  ? ? ?Lab Results  ?Component Value Date  ? WBC 5.9 11/01/2020  ? HGB 13.1 11/01/2020  ? HCT 38.7 11/01/2020  ? PLT 319 11/01/2020  ? GLUCOSE 107 (H) 04/19/2021  ? CHOL 138 02/19/2021  ? TRIG 70.0 02/19/2021  ? HDL 55.00 02/19/2021  ? [place] 69 02/19/2021  ? ALT 6 03/29/2021  ? AST 15 03/29/2021  ? NA 141 04/19/2021  ? K 4.7 04/19/2021  ? CL  103 04/19/2021  ? CREATININE 0.78 04/19/2021  ? BUN 13 04/19/2021  ? CO2 23 04/19/2021  ? TSH 1.13 03/29/2021  ? ? ?Assessment/Plan: ? ?Essential hypertension ?-controlled ?-d/c losartan 50 mg BID ?-Will start Norvasc 5 mg daily. ?-Patient encouraged to continue monitoring BP ?-Patient advised to increase intake of water and sodium this morning as BP was 104/67 prior to taking losartan 50 mg.  Encouraged to check BP throughout the day as may feel symptomatic due to hypotension. ? - Plan: amLODipine (NORVASC) 5 MG tablet ? ?Chest pain, unspecified type ?-Confusing picture given largely normal studies by cardiology including Cardiac CT scoring, ECHO  02/2021 EF 60-65%, Mild TR. ?-3 day Zio patch 04/05/21 with minimum heart rate 36 bpm, max HR 136 bpm, average HR 82 bpm. Predominant underlying rhythm was sinus rhythm.  Second-degree AV block-Mobitz 1 (Wenckebach) was present. ?-Still concerned for possible arrhythmia given episodes of bradycardia picked up by patient's Apple watch. ?-Would consider longer-term Holter monitor ?-continue f/u with Cards. ?-encouraged to keep appt for sleep study. ?-losartan 50 mg BID d/c'd.  Norvasc 5 mg daily started. ?-continue counseling as anxiety may also be contributing to symptoms. ? ?Vitamin D def ?-Vitamin D 15.1 on 04/19/21 ?-Ergocalciferol 50,000 IU weekly x12 weeks ? ?F/u prn in the next month ? ?Grier Mitts, MD ?

## 2021-05-04 ENCOUNTER — Other Ambulatory Visit: Payer: Self-pay

## 2021-05-04 ENCOUNTER — Ambulatory Visit: Payer: BC Managed Care – PPO

## 2021-05-04 DIAGNOSIS — R002 Palpitations: Secondary | ICD-10-CM

## 2021-05-04 NOTE — Progress Notes (Unsigned)
Enrolled for [employer] to mail a ZIO XT long term holter monitor to the patients address on file.  

## 2021-05-06 ENCOUNTER — Encounter: Payer: Self-pay | Admitting: Family Medicine

## 2021-05-07 ENCOUNTER — Encounter: Payer: Self-pay | Admitting: Pulmonary Disease

## 2021-05-07 NOTE — Telephone Encounter (Signed)
Mychart message sent by pt: ?[facility] (supporting Olalere, Adewale A, MD) 3 hours ago (6:44 AM)  ? ?Good Morning! My Cardiologist has asked me to wear a Zio heart monitor for 14 days. I?m just messaging to ask if that will interfere with my upcoming sleep study on Monday. If it will, I can start wearing it after that. If not, I can go ahead and apply it.  ? ? ? ?Routing this as high priority since pt's sleep study is scheduled for Monday. Dr. Jenetta Downer, please advise. ?

## 2021-05-07 NOTE — Telephone Encounter (Signed)
May start the [facility] monitor after sleep study. ? ?There will be a lot of electrode placement during the sleep study. ?

## 2021-05-10 ENCOUNTER — Ambulatory Visit: Payer: BC Managed Care – PPO

## 2021-05-10 DIAGNOSIS — R0683 Snoring: Secondary | ICD-10-CM

## 2021-05-10 DIAGNOSIS — G478 Other sleep disorders: Secondary | ICD-10-CM

## 2021-05-18 ENCOUNTER — Telehealth: Payer: Self-pay | Admitting: Pulmonary Disease

## 2021-05-18 NOTE — Telephone Encounter (Signed)
Please advise if you have read the sleep study? I do not see that it has been completed.  ?

## 2021-05-19 ENCOUNTER — Telehealth: Payer: Self-pay | Admitting: Pulmonary Disease

## 2021-05-19 DIAGNOSIS — R0683 Snoring: Secondary | ICD-10-CM

## 2021-05-19 DIAGNOSIS — F4323 Adjustment disorder with mixed anxiety and depressed mood: Secondary | ICD-10-CM | POA: Diagnosis not present

## 2021-05-19 NOTE — Telephone Encounter (Signed)
I called the patient and gave her the results and she voices understanding.Nothing further needed.  

## 2021-05-19 NOTE — Telephone Encounter (Signed)
Call patient ? ?Sleep study result ? ?Date of study: ?05/10/2021 ? ?Impression: ?Negative study for significant sleep disordered breathing ? ?Recommendation: ?Negative study for significant sleep disordered breathing, monitoring symptoms is appropriate ? ?Continue weight loss efforts ? ?If there remains significant concerns for sleep disordered breathing, an in lab study should be considered ?

## 2021-06-02 DIAGNOSIS — F4323 Adjustment disorder with mixed anxiety and depressed mood: Secondary | ICD-10-CM | POA: Diagnosis not present

## 2021-06-04 ENCOUNTER — Ambulatory Visit: Payer: BC Managed Care – PPO | Admitting: Family Medicine

## 2021-06-04 VITALS — BP 124/92 | HR 75 | Temp 98.7°F | Wt 214.0 lb

## 2021-06-04 DIAGNOSIS — M940 Chondrocostal junction syndrome [Tietze]: Secondary | ICD-10-CM | POA: Diagnosis not present

## 2021-06-04 DIAGNOSIS — I10 Essential (primary) hypertension: Secondary | ICD-10-CM | POA: Diagnosis not present

## 2021-06-04 MED ORDER — SPIRONOLACTONE 25 MG PO TABS: 12.5000 mg | ORAL_TABLET | Freq: Every day | ORAL | 1 refills | Status: DC

## 2021-06-04 NOTE — Progress Notes (Signed)
Subjective:  ? ? Patient ID: Emily Moss, female    DOB: 01-Jun-1980, 41 y.o.   MRN: [id number] ? ?Chief Complaint  ?Patient presents with  ? Follow-up  ?  Medications. Some BP readings are 110s/80s And some are 130s/90s ?Still having a pin in upper rt chest when waking up. Feels flushed sometimes, and feels something in her throat area.  ? ? ?HPI ?Patient was seen today for f/u on ongiong concnerns.  Pt states recently mailed zio patch back.  Wore this one for several days hoping to catch fluctuation in heart rate monitor.  Has yet to hear back about results.  Patient notes feeling somewhat better however still waking up with right-sided upper chest pain/discomfort at times going to the side of her neck.  Taking losartan 50 mg twice daily.  Notes some improvement in BP however not consistent, 140/90, 130/mid 80s, 1 teens over 80s.  Pt no longer having fatigue/lightheadedness. ? ?Past Medical History:  ?Diagnosis Date  ? Abnormal Pap smear 2009  ? LAST PAP 05/2011  ? Asthma   ? CHILDHOOD  ? Diabetes mellitus   ? gestational- diet controlled  ? Eczema   ? Fibroid 2010  ? Gestational diabetes   ? H/O varicella   ? Headache(784.0)   ? one severe headache during pregnancy   ? Heart murmur   ? AT BIRTH RESOLVED  ? Heartburn in pregnancy   ? Infection   ? UTI X 1  ? Infection 2000, [id number]  ? CHLAMYDIA  ? Recurrent upper respiratory infection (URI)   ? RECURRENT BRONCHITIS  ? ? ?No Known Allergies ? ?ROS ?General: Denies fever, chills, night sweats, changes in weight, changes in appetite ?HEENT: Denies headaches, ear pain, changes in vision, rhinorrhea, sore throat ?CV: Denies CP, palpitations, SOB, orthopnea ?Pulm: Denies SOB, cough, wheezing ?GI: Denies abdominal pain, nausea, vomiting, diarrhea, constipation ?GU: Denies dysuria, hematuria, frequency, vaginal discharge ?Msk: Denies muscle cramps, joint pains  + right shoulder pain/fullness ?Neuro: Denies weakness, numbness, tingling ?Skin: Denies rashes,  bruising ?Psych: Denies depression, anxiety, hallucinations ? ?   ?Objective:  ?  ?Blood pressure (!) 124/92, pulse 75, temperature 98.7 ?F (37.1 ?C), temperature source Oral, weight 214 lb (97.1 kg), SpO2 100 %. ? ?Gen. Pleasant, well-nourished, in no distress, normal affect   ?HEENT: [place]/AT, face symmetric, conjunctiva clear, no scleral icterus, PERRLA, EOMI, nares patent without drainage ?Lungs: no accessory muscle use, CTAB, no wheezes or rales ?Cardiovascular: RRR, no m/r/g, no peripheral edema ?Musculoskeletal: Mild TTP of right upper chest.  No deformities, no cyanosis or clubbing, normal tone ?Neuro:  A&Ox3, CN II-XII intact, normal gait ?Skin:  Warm, no lesions/ rash ? ? ?Wt Readings from Last 3 Encounters:  ?06/04/21 214 lb (97.1 kg)  ?05/03/21 212 lb 6.4 oz (96.3 kg)  ?04/19/21 214 lb 6.4 oz (97.3 kg)  ? ? ?Lab Results  ?Component Value Date  ? WBC 5.9 11/01/2020  ? HGB 13.1 11/01/2020  ? HCT 38.7 11/01/2020  ? PLT 319 11/01/2020  ? GLUCOSE 107 (H) 04/19/2021  ? CHOL 138 02/19/2021  ? TRIG 70.0 02/19/2021  ? HDL 55.00 02/19/2021  ? [place] 69 02/19/2021  ? ALT 6 03/29/2021  ? AST 15 03/29/2021  ? NA 141 04/19/2021  ? K 4.7 04/19/2021  ? CL 103 04/19/2021  ? CREATININE 0.78 04/19/2021  ? BUN 13 04/19/2021  ? CO2 23 04/19/2021  ? TSH 1.13 03/29/2021  ? ? ?Assessment/Plan: ? ?Essential hypertension  ?-improving, however discussed starting  a different bp medication given fluctuations in bp readings mid day. ?-will d/c losartan 50 mg ?-start spironolactone 25 mg daily ?-Awaiting Holter monitor results ?-Continue follow-up cardiology ?- Plan: spironolactone (ALDACTONE) 25 MG tablet ? ?Costochondritis ?-likely causing R upper chest discomfort.   ?-Discussed supportive care including heat, stretching, ice, massage, topical analgesics, NSAIDs sparingly given HTN or Tylenol, etc. ?-given handout ? ?F/u as needed ? ?Grier Mitts, MD ?

## 2021-06-07 ENCOUNTER — Encounter: Payer: Self-pay | Admitting: Family Medicine

## 2021-06-10 ENCOUNTER — Encounter: Payer: Self-pay | Admitting: Family Medicine

## 2021-06-16 DIAGNOSIS — Z631 Problems in relationship with in-laws: Secondary | ICD-10-CM | POA: Diagnosis not present

## 2021-06-16 DIAGNOSIS — F4323 Adjustment disorder with mixed anxiety and depressed mood: Secondary | ICD-10-CM | POA: Diagnosis not present

## 2021-06-24 NOTE — Telephone Encounter (Signed)
There is a separate encounter from 4/12 with the results. Closing encounter.

## 2021-07-01 ENCOUNTER — Other Ambulatory Visit (HOSPITAL_COMMUNITY)
Admission: RE | Admit: 2021-07-01 | Discharge: 2021-07-01 | Disposition: A | Discharge status: Home / Self Care | Payer: BC Managed Care – PPO | Source: Ambulatory Visit | Attending: Obstetrics and Gynecology | Admitting: Obstetrics and Gynecology

## 2021-07-01 ENCOUNTER — Other Ambulatory Visit: Payer: Self-pay | Admitting: Obstetrics and Gynecology

## 2021-07-01 DIAGNOSIS — Z01419 Encounter for gynecological examination (general) (routine) without abnormal findings: Secondary | ICD-10-CM | POA: Insufficient documentation

## 2021-07-02 ENCOUNTER — Other Ambulatory Visit: Payer: Self-pay | Admitting: Obstetrics and Gynecology

## 2021-07-02 DIAGNOSIS — Z1231 Encounter for screening mammogram for malignant neoplasm of breast: Secondary | ICD-10-CM

## 2021-07-02 LAB — CYTOLOGY - PAP
Comment: NEGATIVE
Diagnosis: NEGATIVE
High risk HPV: NEGATIVE

## 2021-07-08 DIAGNOSIS — F4323 Adjustment disorder with mixed anxiety and depressed mood: Secondary | ICD-10-CM | POA: Diagnosis not present

## 2021-07-08 DIAGNOSIS — Z629 Problem related to upbringing, unspecified: Secondary | ICD-10-CM | POA: Diagnosis not present

## 2021-07-12 ENCOUNTER — Ambulatory Visit: Payer: BC Managed Care – PPO | Admitting: Cardiology

## 2021-07-12 ENCOUNTER — Encounter: Payer: Self-pay | Admitting: Cardiology

## 2021-07-12 VITALS — BP 116/90 | HR 66 | Ht 60.0 in | Wt 213.2 lb

## 2021-07-12 DIAGNOSIS — M7918 Myalgia, other site: Secondary | ICD-10-CM

## 2021-07-12 DIAGNOSIS — R7303 Prediabetes: Secondary | ICD-10-CM

## 2021-07-12 DIAGNOSIS — I10 Essential (primary) hypertension: Secondary | ICD-10-CM

## 2021-07-12 DIAGNOSIS — D259 Leiomyoma of uterus, unspecified: Secondary | ICD-10-CM | POA: Diagnosis not present

## 2021-07-12 NOTE — Patient Instructions (Signed)
Medication Instructions:  Your physician recommends that you continue on your current medications as directed. Please refer to the Current Medication list given to you today.  *If you need a refill on your cardiac medications before your next appointment, please call your pharmacy*   Lab Work: None If you have labs (blood work) drawn today and your tests are completely normal, you will receive your results only by: MyChart Message (if you have MyChart) OR A paper copy in the mail If you have any lab test that is abnormal or we need to change your treatment, we will call you to review the results.   Testing/Procedures: None   Follow-Up: At [facility], you and your health needs are our priority.  As part of our continuing mission to provide you with exceptional heart care, we have created designated Provider Care Teams.  These Care Teams include your primary Cardiologist (physician) and Advanced Practice Providers (APPs -  Physician Assistants and Nurse Practitioners) who all work together to provide you with the care you need, when you need it.  We recommend signing up for the patient portal called "MyChart".  Sign up information is provided on this After Visit Summary.  MyChart is used to connect with patients for Virtual Visits (Telemedicine).  Patients are able to view lab/test results, encounter notes, upcoming appointments, etc.  Non-urgent messages can be sent to your provider as well.   To learn more about what you can do with MyChart, go to [email or web address].    Your next appointment:   1 year(s)  The format for your next appointment:   In Person  Provider:   Kardie Tobb, DO     Other Instructions   Important Information About Sugar       
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
## 2021-08-09 ENCOUNTER — Ambulatory Visit (INDEPENDENT_AMBULATORY_CARE_PROVIDER_SITE_OTHER): Payer: BC Managed Care – PPO | Admitting: Family Medicine

## 2021-08-09 VITALS — BP 132/78 | HR 70 | Temp 98.1°F | Wt 215.8 lb

## 2021-08-09 DIAGNOSIS — M791 Myalgia, unspecified site: Secondary | ICD-10-CM | POA: Diagnosis not present

## 2021-08-09 DIAGNOSIS — H538 Other visual disturbances: Secondary | ICD-10-CM | POA: Diagnosis not present

## 2021-08-09 DIAGNOSIS — R0789 Other chest pain: Secondary | ICD-10-CM

## 2021-08-09 DIAGNOSIS — I10 Essential (primary) hypertension: Secondary | ICD-10-CM | POA: Diagnosis not present

## 2021-08-09 MED ORDER — PANTOPRAZOLE SODIUM 20 MG PO TBEC: 20.0000 mg | DELAYED_RELEASE_TABLET | Freq: Every day | ORAL | 1 refills | Status: DC

## 2021-08-09 NOTE — Progress Notes (Signed)
Subjective:    Patient ID: Emily Moss, female    DOB: 11-22-1980, 41 y.o.   MRN: [id number]  Chief Complaint  Patient presents with   Medication Reaction    Periodically, feels pressure on chest, left side, upper back. Tightness on L shoulder right by the neck. Pt thinks it may be from the BP med.    Shortness of Breath    Patient reports When laying on left side. Notice it couple of weeks. Also every time driving, "having hard time to breathe.     HPI Patient was seen today for f/u on several ongoing concerns.  Pt notes continued chest pressure in R upper chest and now in L upper chest.  Pt states at times chest discomfort feels better if belches or like it would be better if belches.  Pt denies overt heart burn.  In the past had a cough.  Pt also having discomfort of L lateral chest.  Has a sensation of pressure or like something is poking her in L lateral mid back, typically occurs in evening when brushing her teeth.  Does not happen every night.  At times also feels like she cannot get a good breath in.    Having increased pain in posterior neck that goes up into head at time causing headache.  Getting a massage provider temporary relief.  Pt working on posture as often sits slouched or leaning while at desk working.  Pt denies increased stress though she is planning her mom's retirement party, is taking care of her 2 children, cooking for her husband/family, and working as an Forensic psychologist.    Sleep study a few months ago was normal.  Cardiac eval with CT coronary artery calcium scoring and zio patch negative.  Holter monitor lost by post office.  Pt still having brief episodes of tachycardia. Heart rate per apple watch was 187 while at [employer].  Pt not feeling as bad as she was, but still having days of decreased energy.    Vision in left eye is starting to become blurry at night.  Patient has blue light blocking glasses, but has not been wearing them.  Does not wear contacts or glasses to  improve vision. Past Medical History:  Diagnosis Date   Abnormal Pap smear 2009   LAST PAP 05/2011   Asthma    CHILDHOOD   Diabetes mellitus    gestational- diet controlled   Eczema    Fibroid 2010   Gestational diabetes    H/O varicella    Headache(784.0)    one severe headache during pregnancy    Heart murmur    AT BIRTH RESOLVED   Heartburn in pregnancy    Infection    UTI X 1   Infection 2000, 2002,2008   CHLAMYDIA   Recurrent upper respiratory infection (URI)    RECURRENT BRONCHITIS    No Known Allergies  ROS General: Denies fever, chills, night sweats, changes in weight, changes in appetite HEENT: Denies headaches, ear pain, changes in vision, rhinorrhea, sore throat  + blurred vision, headache CV: Denies CP, palpitations, SOB, orthopnea + chest pressure, SOB, palpitations Pulm: Denies SOB, cough, wheezing  + SOB GI: Denies abdominal pain, nausea, vomiting, diarrhea, constipation GU: Denies dysuria, hematuria, frequency, vaginal discharge Msk: Denies muscle cramps, joint pains  + muscle tension Neuro: Denies weakness, numbness, tingling Skin: Denies rashes, bruising Psych: Denies depression, anxiety, hallucinations     Objective:    Blood pressure 132/78, pulse 70, temperature 98.1 F (36.7 C),  temperature source Oral, weight 215 lb 12.8 oz (97.9 kg), last menstrual period 07/12/2021, SpO2 98 %.  Gen. Pleasant, well-nourished, in no distress, normal affect   HEENT: [place]/AT, face symmetric, conjunctiva clear, no scleral icterus, PERRLA, EOMI, nares patent without drainage Lungs: no accessory muscle use, CTAB, no wheezes or rales Cardiovascular: RRR, no m/r/g, no peripheral edema Musculoskeletal: No deformities, no cyanosis or clubbing, normal tone Neuro:  A&Ox3, CN II-XII intact, normal gait Skin:  Warm, no lesions/ rash   Wt Readings from Last 3 Encounters:  08/09/21 215 lb 12.8 oz (97.9 kg)  07/12/21 213 lb 3.2 oz (96.7 kg)  06/04/21 214 lb (97.1 kg)     Lab Results  Component Value Date   WBC 5.9 11/01/2020   HGB 13.1 11/01/2020   HCT 38.7 11/01/2020   PLT 319 11/01/2020   GLUCOSE 107 (H) 04/19/2021   CHOL 138 02/19/2021   TRIG 70.0 02/19/2021   HDL 55.00 02/19/2021   LDLCALC 69 02/19/2021   ALT 6 03/29/2021   AST 15 03/29/2021   NA 141 04/19/2021   K 4.7 04/19/2021   CL 103 04/19/2021   CREATININE 0.78 04/19/2021   BUN 13 04/19/2021   CO2 23 04/19/2021   TSH 1.13 03/29/2021    Assessment/Plan:  Sensation of chest pressure  -Discussed possible causes including reactive airway disease, anxiety, GERD, palpitations, obstructive airway disease -Cardiac causes ruled out by cardiology as holter monitor, EKG, coronary CT negative -Will start PPI for possible GERD related symptoms. -continue to montior - Plan: pantoprazole (PROTONIX) 20 MG tablet  Muscle tension pain -Likely stress related leading to discomfort in right trapezius and neck causing headaches -Discussed ways to decrease stress -Discussed the importance of self-care -Continue massage, ice, heat, stretching, topical analgesics, Tylenol or NSAIDs as needed -Consider PT, dry needling, acupuncture, or water aerobics. -Continue to monitor  Essential hypertension -stable/controlled -continue spironolactone 12.5 mg daily -Continue lifestyle modifications -Continue follow-up cardiology as needed  Blurred vision -May be contributing to headaches -Discussed scheduling eye exam -Wear bluelight blocking glasses to see if notices improvement. -Discussed eating regular meals/ staying hydrated as hypo or hyperglycemia can cause symptoms. -Obtain hemoglobin A1c at next [facility].  F/u as needed  Grier Mitts, MD

## 2021-08-17 ENCOUNTER — Encounter: Payer: Self-pay | Admitting: Family Medicine

## 2021-08-18 ENCOUNTER — Encounter: Payer: Self-pay | Admitting: Family Medicine

## 2021-08-18 ENCOUNTER — Ambulatory Visit: Payer: BC Managed Care – PPO | Admitting: Family Medicine

## 2021-08-18 VITALS — BP 130/90 | HR 79 | Temp 98.2°F | Wt 214.8 lb

## 2021-08-18 DIAGNOSIS — G4486 Cervicogenic headache: Secondary | ICD-10-CM | POA: Diagnosis not present

## 2021-08-18 DIAGNOSIS — H538 Other visual disturbances: Secondary | ICD-10-CM | POA: Diagnosis not present

## 2021-08-18 DIAGNOSIS — R0602 Shortness of breath: Secondary | ICD-10-CM

## 2021-08-18 DIAGNOSIS — I10 Essential (primary) hypertension: Secondary | ICD-10-CM

## 2021-08-18 LAB — CBC WITH DIFFERENTIAL/PLATELET
Basophils Absolute: 0 10*3/uL (ref 0.0–0.1)
Basophils Relative: 0.7 % (ref 0.0–3.0)
Eosinophils Absolute: 0.1 10*3/uL (ref 0.0–0.7)
Eosinophils Relative: 1.7 % (ref 0.0–5.0)
HCT: 39.5 % (ref 36.0–46.0)
Hemoglobin: 12.9 g/dL (ref 12.0–15.0)
Lymphocytes Relative: 40.8 % (ref 12.0–46.0)
Lymphs Abs: 1.9 10*3/uL (ref 0.7–4.0)
MCHC: 32.6 g/dL (ref 30.0–36.0)
MCV: 82.9 fl (ref 78.0–100.0)
Monocytes Absolute: 0.4 10*3/uL (ref 0.1–1.0)
Monocytes Relative: 8.7 % (ref 3.0–12.0)
Neutro Abs: 2.3 10*3/uL (ref 1.4–7.7)
Neutrophils Relative %: 48.1 % (ref 43.0–77.0)
Platelets: 284 10*3/uL (ref 150.0–400.0)
RBC: 4.77 Mil/uL (ref 3.87–5.11)
RDW: 14.2 % (ref 11.5–15.5)
WBC: 4.8 10*3/uL (ref 4.0–10.5)

## 2021-08-18 LAB — BASIC METABOLIC PANEL
BUN: 15 mg/dL (ref 6–23)
CO2: 30 mEq/L (ref 19–32)
Calcium: 9.5 mg/dL (ref 8.4–10.5)
Chloride: 101 mEq/L (ref 96–112)
Creatinine, Ser: 0.78 mg/dL (ref 0.40–1.20)
GFR: 95.34 mL/min (ref >60.00)
Glucose, Bld: 82 mg/dL (ref 70–99)
Potassium: 3.9 mEq/L (ref 3.5–5.1)
Sodium: 136 mEq/L (ref 135–145)

## 2021-08-18 LAB — TSH: TSH: 1.34 u[IU]/mL (ref 0.35–5.50)

## 2021-08-18 LAB — HEMOGLOBIN A1C: Hgb A1c MFr Bld: 5.8 % (ref 4.6–6.5)

## 2021-08-18 LAB — T4, FREE: Free T4: 0.78 ng/dL (ref 0.60–1.60)

## 2021-08-18 LAB — C-REACTIVE PROTEIN: CRP: <1 mg/dL (ref 0.5–20.0)

## 2021-08-18 NOTE — Progress Notes (Signed)
Subjective:    Patient ID: Emily Moss, female    DOB: 04/15/1980, 41 y.o.   MRN: [id number]  Chief Complaint  Patient presents with   Shortness of Breath    Pt c/o sob worsen and oxygen level drop.    HPI Patient was seen today for ongoing concern.  Patient endorses pO2 level on smart watch being as low as 88% on Monday 7/10 and 91% yesterday 7/11.  Pt denies SOB, wheezing, coughing, chest discomfort at those times.  On Monday pt was at work around 1-2 pm and yesterday it occurred in the evening when pt was laying down 2/2 having a HA.  Pt had pain in posterior neck like someone was grabbing the back of her neck, causing pain to go up into occipital area.  Pt also notes eyelids felt heavy making it hard to keep them open.   Pt still having blurred vision.  Started wearing blue light blocking glasses but not sure it has made a difference.  Has yet to pick up PPI to see if it would improve sensation of chest pressure.  Pt notes bp better, but becomes elevated in the middle of the day, then goes back down.  BP may be 140/88, 134/84, 125/87.  Past Medical History:  Diagnosis Date   Abnormal Pap smear 2009   LAST PAP 05/2011   Asthma    CHILDHOOD   Diabetes mellitus    gestational- diet controlled   Eczema    Fibroid 2010   Gestational diabetes    H/O varicella    Headache(784.0)    one severe headache during pregnancy    Heart murmur    AT BIRTH RESOLVED   Heartburn in pregnancy    Infection    UTI X 1   Infection 2000, 2002,2008   CHLAMYDIA   Recurrent upper respiratory infection (URI)    RECURRENT BRONCHITIS    No Known Allergies  ROS General: Denies fever, chills, night sweats, changes in weight, changes in appetite HEENT: Denies headaches, ear pain, changes in vision, rhinorrhea, sore throat  +changes in vision, eye lids feel heavy, HAs CV: Denies CP, palpitations, SOB, orthopnea  +chest pressure, SOB Pulm: Denies SOB, cough, wheezing  +decreased pO2 per apple  watch GI: Denies abdominal pain, nausea, vomiting, diarrhea, constipation GU: Denies dysuria, hematuria, frequency, vaginal discharge Msk: Denies muscle cramps, joint pains Neuro: Denies weakness, numbness, tingling Skin: Denies rashes, bruising Psych: Denies depression, anxiety, hallucinations    Objective:    Blood pressure (!) 128/98, pulse 79, temperature 98.2 F (36.8 C), temperature source Oral, weight 214 lb 12.8 oz (97.4 kg), SpO2 98 %. Vision Screening   Right eye Left eye Both eyes  Without correction 20/15-2 20/13-2 20/13-2  With correction      Gen. Pleasant, well-nourished, in no distress, normal affect   HEENT: [place]/AT, face symmetric, conjunctiva clear, no scleral icterus, PERRLA, EOMI, fundoscopic exam limited 2/2 inability to dilate pupils, slight discoloration of retina.   Nares patent without drainage, pharynx without erythema or exudate. Neck: No JVD, no thyromegaly, no carotid bruits Lungs: no accessory muscle use, CTAB, no wheezes or rales Cardiovascular: RRR, no m/r/g, no peripheral edema Musculoskeletal: No deformities, no cyanosis or clubbing, normal tone Neuro:  A&Ox3, CN II-XII intact, normal gait Skin:  Warm, no lesions/ rash   Wt Readings from Last 3 Encounters:  08/18/21 214 lb 12.8 oz (97.4 kg)  08/09/21 215 lb 12.8 oz (97.9 kg)  07/12/21 213 lb 3.2 oz (96.7 kg)  Lab Results  Component Value Date   WBC 5.9 11/01/2020   HGB 13.1 11/01/2020   HCT 38.7 11/01/2020   PLT 319 11/01/2020   GLUCOSE 107 (H) 04/19/2021   CHOL 138 02/19/2021   TRIG 70.0 02/19/2021   HDL 55.00 02/19/2021   LDLCALC 69 02/19/2021   ALT 6 03/29/2021   AST 15 03/29/2021   NA 141 04/19/2021   K 4.7 04/19/2021   CL 103 04/19/2021   CREATININE 0.78 04/19/2021   BUN 13 04/19/2021   CO2 23 04/19/2021   TSH 1.13 03/29/2021    Assessment/Plan:  Essential hypertension -elevated -continue lifestyle modifications -continue spironolactone 25 mg daily -for continued  mid-day elevation consider hydralazine at noon. -continue f/u with Cardiology  - Plan: Basic metabolic panel, TSH, T4, Free, Ambulatory referral to Ophthalmology  SOB (shortness of breath)  -6MWT negative.  Lowest pO2 92% -pt encouraged to try protonix 20 mg daily as GERD may be contributing to symptoms.  If no improvement in symptoms after short trial ok to stop medication. -keep f/u appt with Pulmonology - Plan: CBC with Differential/Platelet, TSH, T4, Free, D-dimer, Quantitative  Blurred vision  -Limited fundoscopic exam.  Concern for possible cotton wool spot given discoloration of retina on exam.   -also consider ICH and myasthenia gravis given symptoms.   -referral to Ophthalmology made. -for continued symptoms obtain imaging. - Plan: TSH, T4, Free, Hemoglobin A1c, C-reactive Protein, Ambulatory referral to Ophthalmology  Cervicogenic headache  -discussed HA prevention - Plan: Ambulatory referral to Ophthalmology  F/u prn  Grier Mitts, MD

## 2021-08-19 LAB — D-DIMER, QUANTITATIVE: D-Dimer, Quant: <0.19 ug{FEU}/mL

## 2021-08-25 ENCOUNTER — Encounter: Payer: Self-pay | Admitting: Family Medicine

## 2021-08-29 ENCOUNTER — Encounter: Payer: Self-pay | Admitting: Family Medicine

## 2021-09-20 ENCOUNTER — Ambulatory Visit
Admission: RE | Admit: 2021-09-20 | Discharge: 2021-09-20 | Disposition: A | Discharge status: Home / Self Care | Payer: BC Managed Care – PPO | Source: Ambulatory Visit | Attending: Obstetrics and Gynecology | Admitting: Obstetrics and Gynecology

## 2021-09-20 DIAGNOSIS — Z1231 Encounter for screening mammogram for malignant neoplasm of breast: Secondary | ICD-10-CM | POA: Diagnosis not present

## 2021-09-21 ENCOUNTER — Other Ambulatory Visit: Payer: Self-pay | Admitting: Obstetrics and Gynecology

## 2021-09-21 DIAGNOSIS — R928 Other abnormal and inconclusive findings on diagnostic imaging of breast: Secondary | ICD-10-CM

## 2021-10-04 ENCOUNTER — Encounter: Payer: Self-pay | Admitting: Family Medicine

## 2021-10-07 ENCOUNTER — Ambulatory Visit
Admission: RE | Admit: 2021-10-07 | Discharge: 2021-10-07 | Disposition: A | Discharge status: Home / Self Care | Payer: BC Managed Care – PPO | Source: Ambulatory Visit | Attending: Obstetrics and Gynecology | Admitting: Obstetrics and Gynecology

## 2021-10-07 ENCOUNTER — Other Ambulatory Visit: Payer: Self-pay | Admitting: Obstetrics and Gynecology

## 2021-10-07 ENCOUNTER — Encounter: Payer: Self-pay | Admitting: Cardiology

## 2021-10-07 DIAGNOSIS — N6321 Unspecified lump in the left breast, upper outer quadrant: Secondary | ICD-10-CM | POA: Diagnosis not present

## 2021-10-07 DIAGNOSIS — R928 Other abnormal and inconclusive findings on diagnostic imaging of breast: Secondary | ICD-10-CM | POA: Diagnosis not present

## 2021-10-07 NOTE — Telephone Encounter (Signed)
Patient reports intermittent chest tightness and palpitations. She also reports a feeling of needing to cough when she feels palpitations. This has been ongoing for the past 2 weeks. She denies sob. Appointment made with Vella Raring for 10/08/21

## 2021-10-08 ENCOUNTER — Ambulatory Visit (HOSPITAL_BASED_OUTPATIENT_CLINIC_OR_DEPARTMENT_OTHER): Payer: BC Managed Care – PPO | Admitting: Family

## 2021-10-08 ENCOUNTER — Ambulatory Visit (INDEPENDENT_AMBULATORY_CARE_PROVIDER_SITE_OTHER): Payer: BC Managed Care – PPO

## 2021-10-08 ENCOUNTER — Encounter (HOSPITAL_BASED_OUTPATIENT_CLINIC_OR_DEPARTMENT_OTHER): Payer: Self-pay | Admitting: Family

## 2021-10-08 VITALS — BP 128/76 | HR 69 | Ht 60.0 in | Wt 217.0 lb

## 2021-10-08 DIAGNOSIS — I10 Essential (primary) hypertension: Secondary | ICD-10-CM | POA: Diagnosis not present

## 2021-10-08 DIAGNOSIS — R002 Palpitations: Secondary | ICD-10-CM

## 2021-10-08 DIAGNOSIS — R7303 Prediabetes: Secondary | ICD-10-CM

## 2021-10-08 NOTE — Progress Notes (Signed)
Office Visit    Patient Name: Emily Moss Date of Encounter: 10/08/2021  PCP:  Billie Ruddy, MD   [place]  Cardiologist:  Berniece Salines, DO  Advanced Practice Provider:  No care team member to display Electrophysiologist:  None      Chief Complaint    [name] is a 41 y.o. female presents today for palpitations, shortness of breath  Past Medical History    Past Medical History:  Diagnosis Date   Abnormal Pap smear 2009   LAST PAP 05/2011   Asthma    CHILDHOOD   Diabetes mellitus    gestational- diet controlled   Eczema    Fibroid 2010   Gestational diabetes    H/O varicella    Headache(784.0)    one severe headache during pregnancy    Heart murmur    AT BIRTH RESOLVED   Heartburn in pregnancy    Infection    UTI X 1   Infection 2000, 2002,2008   CHLAMYDIA   Recurrent upper respiratory infection (URI)    RECURRENT BRONCHITIS   Past Surgical History:  Procedure Laterality Date   CESAREAN SECTION  01/20/2012   Procedure: CESAREAN SECTION;  Surgeon: Eldred Manges, MD;  Location: [facility];  Service: Obstetrics;  Laterality: N/A;   CESAREAN SECTION N/A 05/03/2017   Procedure: CESAREAN SECTION;  Surgeon: Christophe Louis, MD;  Location: [facility];  Service: Obstetrics;  Laterality: N/A;  EDD 05/31/17   NO PAST SURGERIES     WISDOM TOOTH EXTRACTION  2011   x 2    Allergies  No Known Allergies  History of Present Illness    Emily Moss is a 41 y.o. female with a hx of hypertension, gestational diabetes, prediabetes, hypertensive disorder in pregnancy leading to preeclampsia with her second child, obesity, suspected OSA last seen 07/12/2021.  Initially saw Dr. Claiborne Billings January 2023 for echocardiogram which revealed normal LVEF, mild MR.  Prior to that she had palpitations with ZIO showing no significant arrhythmia.  She transition to Dr. Harriet Masson 04/2021 for chest pain.  Coronary CTA with coronary calcium score of 0.  Some of  her symptoms improved after transition to losartan.  She had some chest pain which was reproducible on exam and thought to be musculoskeletal.  08/18/21 PCP visit with SOB thought to be due to GERD. 6 minute walk test with lowest O2 92%. Encouraged to discuss with pulmonology.  Apple Watch strip reviewed.  Heart rate as low as 44 bpm and that this is a 24-hour period and anticipate it was during sleep.  Oxygen levels monitor via Apple Watch with isolated reading of 84% however likely poor skin connection.  Tells me she had been feeling fine then starting this week got back from the beach and sitting at her desk felt "drained". She looked at her Apple Watch and her heart rate was in the 50s. It did come up when she was active.  No lightheadedness, dizziness.  Tells me in the mornings she wakes up "feeling weird". She experiences some upper chest pressure first thing in the morning also associated with hoarseness.  Taking Protonix daily as prescribed.  She also felt achey under her left breast.  Has been exercising with yoga and walking.  Not doing Zumba as often as it made her feel drained.  She reports no stressors but is tearful during exam.   EKGs/Labs/Other Studies Reviewed:   The following studies were reviewed today:  EKG:  EKG is ordered today.  The ekg ordered today demonstrates NSR 69 bpm. No acute ST/T wave changes.   Recent Labs: 03/29/2021: ALT 6 04/19/2021: Magnesium 1.9 08/18/2021: BUN 15; Creatinine, Ser 0.78; Hemoglobin 12.9; Platelets 284.0; Potassium 3.9; Sodium 136; TSH 1.34  Recent Lipid Panel    Component Value Date/Time   CHOL 138 02/19/2021 1023   TRIG 70.0 02/19/2021 1023   HDL 55.00 02/19/2021 1023   CHOLHDL 3 02/19/2021 1023   VLDL 14.0 02/19/2021 1023   LDLCALC 69 02/19/2021 1023   Home Medications   Current Meds  Medication Sig   pantoprazole (PROTONIX) 20 MG tablet Take 1 tablet (20 mg total) by mouth daily.   spironolactone (ALDACTONE) 25 MG tablet Take 0.5  tablets (12.5 mg total) by mouth daily. (Patient taking differently: Take 25 mg by mouth daily.)     Review of Systems      All other systems reviewed and are otherwise negative except as noted above.  Physical Exam    VS:  BP 128/76   Pulse 69   Ht 5' (1.524 m)   Wt 217 lb (98.4 kg)   BMI 42.38 kg/m  , BMI Body mass index is 42.38 kg/m.  Wt Readings from Last 3 Encounters:  10/08/21 217 lb (98.4 kg)  08/18/21 214 lb 12.8 oz (97.4 kg)  08/09/21 215 lb 12.8 oz (97.9 kg)    GEN: Well nourished, well developed, in no acute distress. HEENT: normal. Neck: Supple, no JVD, carotid bruits, or masses. Cardiac: RRR, no murmurs, rubs, or gallops. No clubbing, cyanosis, edema.  Radials/PT 2+ and equal bilaterally.  Respiratory:  Respirations regular and unlabored, clear to auscultation bilaterally. GI: Soft, nontender, nondistended. MS: No deformity or atrophy. Skin: Warm and dry, no rash. Neuro:  Strength and sensation are intact. Psych: Normal affect.  Assessment & Plan    Palpitations / Dypnea / GERD - Monitor 02/2021 NSR with second degree AV block, <1% PVC/PAC burden. Sleep study negative. Echo 03/2021 normal LVEF, mild MR. CTA 04/28/21 coronary calcium score 0. Repeat monitor 04/2021 lost in mail. Described tightness in her upper chest in morning and evening hours, "hoarseness". Consider PVC vs GERD. Increase Protonix to '20mg'$  BID x 1 week. Encouraged to stay well hydrated. Suspect some stress contributory as tearful during portions of exam discussing her understandable frustration with feeling poorly and not having answers. 7 day ZIO in clinic. Reassurance provided that isolated low heart rate or O2 by Apple watch are likely erroneous. 6 min walk test with PCP in July O2 lowest 92%. Upcoming appt with pulmonology, encouraged to discuss possible PFTs.  HTN - BP well controlled. Continue current antihypertensive regimen spironolactone 25 mg daily. Heart healthy diet and regular  cardiovascular exercise encouraged.    Prediabetes - Continue to follow with PCP.  Presently managed with diet and exercise.  08/18/2021 A1c 5.8.  Obesity - Weight loss via diet and exercise encouraged. Discussed the impact being overweight would have on cardiovascular risk.  Offered referral to PREP exercise program which she will consider       Disposition: Follow up  07/2022  with Berniece Salines, DO or APP.  Signed, Loel Dubonnet, NP 10/08/2021, 8:35 AM [place]

## 2021-10-08 NOTE — Patient Instructions (Addendum)
Medication Instructions:  Your physician has recommended you make the following change in your medication:   Recommend taking Protonix twice per day for one week.  Send [place] a MyChart message in one week to check in.  *If you need a refill on your cardiac medications before your next appointment, please call your pharmacy*  Lab Work: None ordered today.   Testing/Procedures: Your physician has recommended that you wear a Zio monitor.   This monitor is a medical device that records the heart's electrical activity. Doctors most often use these monitors to diagnose arrhythmias. Arrhythmias are problems with the speed or rhythm of the heartbeat. The monitor is a small device applied to your chest. You can wear one while you do your normal daily activities. While wearing this monitor if you have any symptoms to push the button and record what you felt. Once you have worn this monitor for the period of time provider prescribed (for 7 days), you will return the monitor device in the postage paid box. Once it is returned they will download the data collected and provide [place] with a report which the provider will then review and we will call you with those results. Important tips:  Avoid showering during the first 24 hours of wearing the monitor. Avoid excessive sweating to help maximize wear time. Do not submerge the device, no hot tubs, and no swimming pools. Keep any lotions or oils away from the patch. After 24 hours you may shower with the patch on. Take brief showers with your back facing the shower head.  Do not remove patch once it has been placed because that will interrupt data and decrease adhesive wear time. Push the button when you have any symptoms and write down what you were feeling. Once you have completed wearing your monitor, remove and place into box which has postage paid and place in your outgoing mailbox.  If for some reason you have misplaced your box then call our office and we  can provide another box and/or mail it off for you.  Follow-Up: At [facility], you and your health needs are our priority.  As part of our continuing mission to provide you with exceptional heart care, we have created designated Provider Care Teams.  These Care Teams include your primary Cardiologist (physician) and Advanced Practice Providers (APPs -  Physician Assistants and Nurse Practitioners) who all work together to provide you with the care you need, when you need it.  We recommend signing up for the patient portal called "MyChart".  Sign up information is provided on this After Visit Summary.  MyChart is used to connect with patients for Virtual Visits (Telemedicine).  Patients are able to view lab/test results, encounter notes, upcoming appointments, etc.  Non-urgent messages can be sent to your provider as well.   To learn more about what you can do with MyChart, go to [email or web address].    Your next appointment:   07/2022 with Dr. Harriet Masson  Other Instructions   If you want to participate in [facility] program let us know.   Recommend considering pulmonary function testing with pulmonology.   [facility]: [phone]    To prevent palpitations: Make sure you are adequately hydrated.  Avoid and/or limit caffeine containing beverages like soda or tea. Exercise regularly.  Manage stress well. Some over the counter medications can cause palpitations such as Benadryl, AdvilPM, TylenolPM. Regular Advil or Tylenol do not cause palpitations.

## 2021-10-11 ENCOUNTER — Other Ambulatory Visit: Payer: Self-pay | Admitting: Family Medicine

## 2021-10-11 DIAGNOSIS — R0789 Other chest pain: Secondary | ICD-10-CM

## 2021-10-13 ENCOUNTER — Ambulatory Visit: Payer: BC Managed Care – PPO

## 2021-10-13 ENCOUNTER — Ambulatory Visit: Payer: BC Managed Care – PPO | Admitting: Pulmonary Disease

## 2021-10-13 ENCOUNTER — Telehealth: Payer: Self-pay | Admitting: Family Medicine

## 2021-10-13 ENCOUNTER — Encounter: Payer: Self-pay | Admitting: Pulmonary Disease

## 2021-10-13 VITALS — BP 128/86 | HR 73 | Ht 65.0 in | Wt 219.8 lb

## 2021-10-13 DIAGNOSIS — R0789 Other chest pain: Secondary | ICD-10-CM | POA: Diagnosis not present

## 2021-10-13 DIAGNOSIS — G4719 Other hypersomnia: Secondary | ICD-10-CM | POA: Diagnosis not present

## 2021-10-13 NOTE — Telephone Encounter (Signed)
Referral faxed, fax confirmation received.
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
## 2021-10-13 NOTE — Patient Instructions (Signed)
We will schedule you for an in lab polysomnogram  Schedule you for a breathing study-can be done on the day of visit  Chest x-ray on the day of next visit-only need done if you are having symptoms  You may get yourself a pulse oximeter to check your oxygen periodically to reassure yourself of your oxygen levels  I will see you back in about 8 to 12 weeks  Call us with concerns  Graded exercises as tolerated

## 2021-10-13 NOTE — Telephone Encounter (Signed)
Derrick with [facility] calling to request the referral be re-faxed to the following number.  Fax: [phone]

## 2021-10-14 ENCOUNTER — Other Ambulatory Visit: Payer: Self-pay | Admitting: Family Medicine

## 2021-10-14 DIAGNOSIS — I10 Essential (primary) hypertension: Secondary | ICD-10-CM

## 2021-10-14 MED ORDER — SPIRONOLACTONE 25 MG PO TABS: 25.0000 mg | ORAL_TABLET | Freq: Every day | ORAL | 3 refills | Status: DC

## 2021-10-21 DIAGNOSIS — I10 Essential (primary) hypertension: Secondary | ICD-10-CM | POA: Diagnosis not present

## 2021-10-21 DIAGNOSIS — R002 Palpitations: Secondary | ICD-10-CM | POA: Diagnosis not present

## 2021-10-25 ENCOUNTER — Encounter: Payer: Self-pay | Admitting: Family Medicine

## 2021-10-26 ENCOUNTER — Telehealth (HOSPITAL_BASED_OUTPATIENT_CLINIC_OR_DEPARTMENT_OTHER): Payer: Self-pay

## 2021-10-26 LAB — CBC AND DIFFERENTIAL
HCT: 41 (ref 36–46)
Hemoglobin: 12.9 (ref 12.0–16.0)
Neutrophils Absolute: 40
WBC: 4.2

## 2021-10-26 LAB — TSH: TSH: 1.05 (ref 0.41–5.90)

## 2021-10-26 LAB — BASIC METABOLIC PANEL
BUN: 15 (ref 4–21)
Chloride: 101 (ref 99–108)
Creatinine: 0.8 (ref 0.5–1.1)
Glucose: 105
Potassium: 4.5 mEq/L (ref 3.5–5.1)
Sodium: 139 (ref 137–147)

## 2021-10-26 LAB — HEPATIC FUNCTION PANEL
ALT: 8 U/L (ref 7–35)
AST: 22 (ref 13–35)
Alkaline Phosphatase: 55 (ref 25–125)
Bilirubin, Total: 0.4

## 2021-10-26 LAB — LIPID PANEL
Cholesterol: 155 (ref 0–200)
HDL: 54 (ref 35–70)
LDL Cholesterol: 88
Triglycerides: 66 (ref 40–160)

## 2021-10-26 LAB — CBC: RBC: 4.98 (ref 3.87–5.11)

## 2021-10-26 LAB — COMPREHENSIVE METABOLIC PANEL
Calcium: 9.5 (ref 8.7–10.7)
Globulin: 2.8

## 2021-10-26 LAB — PROTEIN / CREATININE RATIO, URINE: Albumin, U: 4.8

## 2021-10-26 NOTE — Telephone Encounter (Addendum)
Results called to patient who verbalizes understanding!  Patient already scheduled for in lab sleep study!    ----- Message from Loel Dubonnet, NP sent at 10/26/2021  1:21 PM EDT ----- Monitor with predominantly normal sinus rhythm.  Average heart rate 81 bpm.  There were episodes of second-degree AV block predominantly at nighttime.  This is a common finding and sleep apnea.  Home sleep study in April negative however, for further evaluation would recommend in lab sleep study for further evaluation.

## 2021-10-27 MED ORDER — PANTOPRAZOLE SODIUM 20 MG PO TBEC: 20.0000 mg | DELAYED_RELEASE_TABLET | Freq: Two times a day (BID) | ORAL | 1 refills | Status: DC

## 2021-10-27 NOTE — Addendum Note (Signed)
Addended by: Elza Rafter D on: 10/27/2021 12:45 PM   Modules accepted: Orders

## 2021-11-08 ENCOUNTER — Ambulatory Visit: Payer: BC Managed Care – PPO | Admitting: Family Medicine

## 2021-11-10 ENCOUNTER — Encounter: Payer: Self-pay | Admitting: Family Medicine

## 2021-11-10 ENCOUNTER — Ambulatory Visit: Payer: BC Managed Care – PPO | Admitting: Family Medicine

## 2021-11-10 VITALS — BP 124/82 | HR 75 | Temp 98.6°F | Wt 221.0 lb

## 2021-11-10 DIAGNOSIS — J22 Unspecified acute lower respiratory infection: Secondary | ICD-10-CM

## 2021-11-10 DIAGNOSIS — I10 Essential (primary) hypertension: Secondary | ICD-10-CM

## 2021-11-10 DIAGNOSIS — R0789 Other chest pain: Secondary | ICD-10-CM | POA: Diagnosis not present

## 2021-11-10 DIAGNOSIS — Z23 Encounter for immunization: Secondary | ICD-10-CM | POA: Diagnosis not present

## 2021-11-10 MED ORDER — AMOXICILLIN-POT CLAVULANATE 875-125 MG PO TABS: 1.0000 | ORAL_TABLET | Freq: Two times a day (BID) | ORAL | 0 refills | Status: AC

## 2021-11-10 NOTE — Progress Notes (Signed)
Subjective:    Patient ID: Emily Moss, female    DOB: December 15, 1980, 41 y.o.   MRN: [id number]  Chief Complaint  Patient presents with   Follow-up    Med adjustments working good.     HPI Patient was seen today for f/u.  Pt states she is feeling better.  No longer having R upper chest discomfort/pressure.  Taking Protonix 20 mg twice daily.  Still having intermittent R shoulder muscle tension, but may be related to the way pt sleeps.  In a few wks having a sleep study done at sleep center.  PFTs scheduled with pulmonology.  This has been fairly stable, staying above 90%.  Pt endorses productive cough for about a wk. Throat initially scratchy at start of symptoms.  Denies upper respiratory symptoms such as facial pain/pressure, congestion, rhinorrhea, postnasal drainage, sore throat.  Pt's mom also with similar symptoms.  States her daughter is in preschool, but has not been sick recently.  Pt tried OTC safe-tussin.  Interested in influenza vaccine.  Past Medical History:  Diagnosis Date   Abnormal Pap smear 2009   LAST PAP 05/2011   Asthma    CHILDHOOD   Diabetes mellitus    gestational- diet controlled   Eczema    Fibroid 2010   Gestational diabetes    H/O varicella    Headache(784.0)    one severe headache during pregnancy    Heart murmur    AT BIRTH RESOLVED   Heartburn in pregnancy    Infection    UTI X 1   Infection 2000, 2002,2008   CHLAMYDIA   Recurrent upper respiratory infection (URI)    RECURRENT BRONCHITIS    No Known Allergies  ROS General: Denies fever, chills, night sweats, changes in weight, changes in appetite HEENT: Denies headaches, ear pain, changes in vision, rhinorrhea, sore throat CV: Denies CP, palpitations, SOB, orthopnea Pulm: Denies SOB, cough, wheezing +cough GI: Denies abdominal pain, nausea, vomiting, diarrhea, constipation GU: Denies dysuria, hematuria, frequency, vaginal discharge Msk: Denies muscle cramps, joint pains Neuro: Denies  weakness, numbness, tingling Skin: Denies rashes, bruising Psych: Denies depression, anxiety, hallucinations     Objective:    Blood pressure 124/82, pulse 75, temperature 98.6 F (37 C), temperature source Oral, weight 221 lb (100.2 kg), SpO2 96 %.  Gen. Pleasant, well-nourished, in no distress, normal affect   HEENT: [place]/AT, face symmetric, conjunctiva clear, no scleral icterus, PERRLA, EOMI, nares patent without drainage, pharynx without erythema or exudate. TMs normal b/l.  No cervical lymphadenopathy. Lungs: no cough during exam, no accessory muscle use, scattered rhonchi, otherwise clear Cardiovascular: RRR, no m/r/g, no peripheral edema Neuro:  A&Ox3, CN II-XII intact, normal gait Skin:  Warm, no lesions/ rash  Wt Readings from Last 3 Encounters:  11/10/21 221 lb (100.2 kg)  10/13/21 219 lb 12.8 oz (99.7 kg)  10/08/21 217 lb (98.4 kg)    Lab Results  Component Value Date   WBC 4.8 08/18/2021   HGB 12.9 08/18/2021   HCT 39.5 08/18/2021   PLT 284.0 08/18/2021   GLUCOSE 82 08/18/2021   CHOL 138 02/19/2021   TRIG 70.0 02/19/2021   HDL 55.00 02/19/2021   LDLCALC 69 02/19/2021   ALT 6 03/29/2021   AST 15 03/29/2021   NA 136 08/18/2021   K 3.9 08/18/2021   CL 101 08/18/2021   CREATININE 0.78 08/18/2021   BUN 15 08/18/2021   CO2 30 08/18/2021   TSH 1.34 08/18/2021   HGBA1C 5.8 08/18/2021  Assessment/Plan:  Acute lower respiratory infection  -discussed possible causes including allergies, viral etiology vs bacterial. -consider OTC antihistamine such as Claritin, Allegra, Zyrtec, Xyzal or saline nasal rinse.  Can also try Mucinex HBP -For continued or worsening symptoms start Augmentin.  Wait-and-see Rx given - Plan: amoxicillin-clavulanate (AUGMENTIN) 875-125 MG tablet  Need for influenza vaccination  - Plan: Flu Vaccine QUAD 6+ mos PF IM (Fluarix Quad PF)  Essential hypertension -Controlled -Continue spironolactone 25 mg daily  Sensation of chest  pressure -Improved/resolved -Continue Protonix 20 mg twice daily  F/u as needed  Grier Mitts, MD

## 2021-11-23 ENCOUNTER — Encounter: Payer: Self-pay | Admitting: Family Medicine

## 2021-11-25 ENCOUNTER — Ambulatory Visit: Payer: BC Managed Care – PPO | Admitting: Family Medicine

## 2021-11-26 ENCOUNTER — Ambulatory Visit (INDEPENDENT_AMBULATORY_CARE_PROVIDER_SITE_OTHER): Payer: BC Managed Care – PPO | Admitting: Family Medicine

## 2021-11-26 ENCOUNTER — Ambulatory Visit: Payer: BC Managed Care – PPO | Admitting: Family Medicine

## 2021-11-26 VITALS — BP 142/90 | HR 70 | Temp 98.7°F | Wt 220.0 lb

## 2021-11-26 DIAGNOSIS — M791 Myalgia, unspecified site: Secondary | ICD-10-CM

## 2021-11-26 DIAGNOSIS — I10 Essential (primary) hypertension: Secondary | ICD-10-CM

## 2021-11-26 MED ORDER — CYCLOBENZAPRINE HCL 5 MG PO TABS: 5.0000 mg | ORAL_TABLET | Freq: Three times a day (TID) | ORAL | 1 refills | Status: DC | PRN

## 2021-11-26 NOTE — Progress Notes (Signed)
Subjective:    Patient ID: Emily Moss, female    DOB: Dec 30, 1980, 41 y.o.   MRN: [id number]  Chief Complaint  Patient presents with  . Hypertension    Best friends husband was rushed to ED Sunday or Monday, BP has ben up since. Has been ranging 150s/90s. Back pain in mid back, just feels sore and bothersome. Havin HA. Still has a cough. Just feeling yucky.    HPI Patient was seen today for acute concern.  Patient endorses BP elevation after hearing bad news.  A family friend was rushed to the emergency department due to an aneurysm.  Patient states after going to the hospital her blood pressure has remained elevated.  Past Medical History:  Diagnosis Date  . Abnormal Pap smear 2009   LAST PAP 05/2011  . Asthma    CHILDHOOD  . Diabetes mellitus    gestational- diet controlled  . Eczema   . Fibroid 2010  . Gestational diabetes   . H/O varicella   . Headache(784.0)    one severe headache during pregnancy   . Heart murmur    AT BIRTH RESOLVED  . Heartburn in pregnancy   . Infection    UTI X 1  . Infection 2000, [id number]   CHLAMYDIA  . Recurrent upper respiratory infection (URI)    RECURRENT BRONCHITIS    No Known Allergies  ROS General: Denies fever, chills, night sweats, changes in weight, changes in appetite HEENT: Denies headaches, ear pain, changes in vision, rhinorrhea, sore throat CV: Denies CP, palpitations, SOB, orthopnea Pulm: Denies SOB, cough, wheezing GI: Denies abdominal pain, nausea, vomiting, diarrhea, constipation GU: Denies dysuria, hematuria, frequency, vaginal discharge Msk: Denies muscle cramps, joint pains Neuro: Denies weakness, numbness, tingling Skin: Denies rashes, bruising Psych: Denies depression, anxiety, hallucinations      Objective:    Blood pressure (!) 142/90, pulse 70, temperature 98.7 F (37.1 C), temperature source Oral, weight 220 lb (99.8 kg), SpO2 99 %.   Gen. Pleasant, well-nourished, in no distress, normal affect   *** HEENT: [place]/AT, face symmetric, conjunctiva clear, no scleral icterus, PERRLA, EOMI, nares patent without drainage, pharynx without erythema or exudate. Neck: No JVD, no thyromegaly, no carotid bruits Lungs: no accessory muscle use, CTAB, no wheezes or rales Cardiovascular: RRR, no m/r/g, no ***peripheral edema Abdomen: BS present, soft, NT/ND, no hepatosplenomegaly. Musculoskeletal: No deformities, no cyanosis or clubbing, normal tone Neuro:  A&Ox3, CN II-XII intact, normal gait Skin:  Warm, no lesions/ rash   Wt Readings from Last 3 Encounters:  11/26/21 220 lb (99.8 kg)  11/10/21 221 lb (100.2 kg)  10/13/21 219 lb 12.8 oz (99.7 kg)    Lab Results  Component Value Date   WBC 4.2 10/26/2021   HGB 12.9 10/26/2021   HCT 41 10/26/2021   PLT 284.0 08/18/2021   GLUCOSE 82 08/18/2021   CHOL 155 10/26/2021   TRIG 66 10/26/2021   HDL 54 10/26/2021   LDLCALC 88 10/26/2021   ALT 8 10/26/2021   AST 22 10/26/2021   NA 139 10/26/2021   K 4.5 10/26/2021   CL 101 10/26/2021   CREATININE 0.8 10/26/2021   BUN 15 10/26/2021   CO2 30 08/18/2021   TSH 1.05 10/26/2021   HGBA1C 5.8 08/18/2021    Assessment/Plan:  Muscle tension pain - Plan: cyclobenzaprine (FLEXERIL) 5 MG tablet  Essential hypertension  F/u ***  Grier Mitts, MD

## 2021-11-30 ENCOUNTER — Ambulatory Visit (HOSPITAL_BASED_OUTPATIENT_CLINIC_OR_DEPARTMENT_OTHER): Payer: BC Managed Care – PPO | Attending: Pulmonary Disease | Admitting: Pulmonary Disease

## 2021-11-30 DIAGNOSIS — R5383 Other fatigue: Secondary | ICD-10-CM | POA: Insufficient documentation

## 2021-11-30 DIAGNOSIS — G4719 Other hypersomnia: Secondary | ICD-10-CM | POA: Diagnosis not present

## 2021-11-30 DIAGNOSIS — R0683 Snoring: Secondary | ICD-10-CM | POA: Insufficient documentation

## 2021-11-30 DIAGNOSIS — G478 Other sleep disorders: Secondary | ICD-10-CM | POA: Insufficient documentation

## 2021-12-06 ENCOUNTER — Other Ambulatory Visit: Payer: Self-pay | Admitting: Family Medicine

## 2021-12-12 ENCOUNTER — Telehealth: Payer: Self-pay | Admitting: Pulmonary Disease

## 2021-12-12 NOTE — Telephone Encounter (Signed)
Call patient  Sleep study result  Date of study: 11/30/2021  Impression: Negative study for significant sleep disordered breathing Snoring   Recommendation: Encourage weight loss measures  Optimize sleep hygiene  Behavioral modifications that may help snoring include sleeping with the head of the bed elevated, lateral sleep as able, weight loss will help  Follow-up as previously scheduled

## 2021-12-12 NOTE — Procedures (Signed)
POLYSOMNOGRAPHY  Last, First: Sadeen, Wiegel MRN: [id number] Gender: Female Age (years): 41 Weight (lbs): 217 DOB: 1981/01/13 BMI: 36 Primary Care: No PCP Epworth Score: 6 Referring: Laurin Coder MD Technician: Laren Everts Interpreting: Laurin Coder MD Study Type: NPSG Ordered Study Type: NPSG Study date: 11/30/2021 Location: [place] CLINICAL INFORMATION Emily Moss is a 41 year old Female and was referred to the sleep center for evaluation of G47.80 Other Sleep Disorders. Indications include Daytime Fatigue, Diabetes, Hypertension, Obesity, Snoring, Witnesses Apnea / Gasping During Sleep.   Most recent polysomnogram dated 05/10/2021 revealed an AHI of 2.2/h. MEDICATIONS Patient self administered medications include: N/A. Medications administered during study include No sleep medicine administered.  SLEEP STUDY TECHNIQUE A multi-channel overnight Polysomnography study was performed. The channels recorded and monitored were central and occipital EEG, electrooculogram (EOG), submentalis EMG (chin), nasal and oral airflow, thoracic and abdominal wall motion, anterior tibialis EMG, snore microphone, electrocardiogram, and a pulse oximetry. TECHNICIAN COMMENTS Comments added by Technician: NONE Comments added by Scorer: N/A SLEEP ARCHITECTURE The study was initiated at 10:08:01 PM and terminated at 4:55:35 AM. The total recorded time was 407.6 minutes. EEG confirmed total sleep time was 334 minutes yielding a sleep efficiency of 82.0%%. Sleep onset after lights out was 32.5 minutes with a REM latency of 93.0 minutes. The patient spent 5.8%% of the night in stage N1 sleep, 64.4%% in stage N2 sleep, 0.0%% in stage N3 and 29.8% in REM. Wake after sleep onset (WASO) was 41.1 minutes. The Arousal Index was 14.2/hour. RESPIRATORY PARAMETERS There were a total of 20 respiratory disturbances out of which 0 were apneas ( 0 obstructive, 0 mixed, 0 central) and 20 hypopneas. The  apnea/hypopnea index (AHI) was 3.6 events/hour. The central sleep apnea index was 0 events/hour. The REM AHI was 0.6 events/hour and NREM AHI was 4.9 events/hour. The supine AHI was 5.0 events/hour and the non supine AHI was 2.7 supine during 39.22% of sleep. Respiratory disturbances were associated with oxygen desaturation down to a nadir of 91.0% during sleep. The mean oxygen saturation during the study was 96.2%.  LEG MOVEMENT DATA The total leg movements were 0 with a resulting leg movement index of 0.0/hr .Associated arousal with leg movement index was 0.0/hr.  CARDIAC DATA The underlying cardiac rhythm was most consistent with sinus rhythm. Mean heart rate during sleep was 72.1 bpm. Additional rhythm abnormalities include None.  IMPRESSIONS - No Significant Obstructive Sleep apnea(OSA) - EKG showed no cardiac abnormalities. - No significant Oxygen Desaturation - The patient snored with moderate snoring volume. - No significant periodic leg movements(PLMs) during sleep. However, no significant associated arousals. 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 - Sleep hygiene should be reviewed to assess factors that may improve sleep quality. - Weight management and regular exercise should be initiated or continued. - Clinical follow-up symptoms  [Electronically signed] 12/12/2021 05:24 PM  Sherrilyn Rist MD NPI: [id number]

## 2021-12-13 ENCOUNTER — Encounter: Payer: Self-pay | Admitting: Pulmonary Disease

## 2021-12-13 ENCOUNTER — Ambulatory Visit (INDEPENDENT_AMBULATORY_CARE_PROVIDER_SITE_OTHER): Payer: BC Managed Care – PPO | Admitting: Pulmonary Disease

## 2021-12-13 VITALS — BP 128/76 | HR 76 | Temp 97.8°F | Ht 65.0 in | Wt 221.4 lb

## 2021-12-13 DIAGNOSIS — G478 Other sleep disorders: Secondary | ICD-10-CM | POA: Diagnosis not present

## 2021-12-13 DIAGNOSIS — G4719 Other hypersomnia: Secondary | ICD-10-CM

## 2021-12-13 DIAGNOSIS — R0789 Other chest pain: Secondary | ICD-10-CM | POA: Diagnosis not present

## 2021-12-13 LAB — PULMONARY FUNCTION TEST
DL/VA % pred: 126 %
DL/VA: 5.59 ml/min/mmHg/L
DLCO cor % pred: 105 %
DLCO cor: 23.94 ml/min/mmHg
DLCO unc % pred: 104 %
DLCO unc: 23.56 ml/min/mmHg
FEF 25-75 Post: 4.06 L/sec
FEF 25-75 Pre: 3.57 L/sec
FEF2575-%Change-Post: 13 %
FEF2575-%Pred-Post: 127 %
FEF2575-%Pred-Pre: 112 %
FEV1-%Change-Post: 2 %
FEV1-%Pred-Post: 88 %
FEV1-%Pred-Pre: 87 %
FEV1-Post: 2.76 L
FEV1-Pre: 2.71 L
FEV1FVC-%Change-Post: 3 %
FEV1FVC-%Pred-Pre: 106 %
FEV6-%Change-Post: -1 %
FEV6-%Pred-Post: 81 %
FEV6-%Pred-Pre: 82 %
FEV6-Post: 3.05 L
FEV6-Pre: 3.1 L
FEV6FVC-%Pred-Post: 101 %
FEV6FVC-%Pred-Pre: 101 %
FVC-%Change-Post: -1 %
FVC-%Pred-Post: 79 %
FVC-%Pred-Pre: 81 %
FVC-Post: 3.05 L
FVC-Pre: 3.1 L
Post FEV1/FVC ratio: 91 %
Post FEV6/FVC ratio: 100 %
Pre FEV1/FVC ratio: 87 %
Pre FEV6/FVC Ratio: 100 %
RV % pred: 81 %
RV: 1.33 L
TLC % pred: 86 %
TLC: 4.5 L

## 2021-12-13 NOTE — Progress Notes (Signed)
PFT done today. 
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
## 2021-12-13 NOTE — Patient Instructions (Signed)
I will see you back in 6 months  Your breathing study today was within normal limits  Your most recent sleep study also did not show significant obstructive sleep apnea  Behavioral modifications-sleeping with the head of bed elevated, promoting lateral sleep may help snoring  Call with significant concerns

## 2021-12-13 NOTE — Telephone Encounter (Signed)
Called and spoke to patient and she was in the room with Dr Jenetta Downer in office visit when I was on the phone with her. He will go over results with her in vi ist. Nothing further needed

## 2021-12-21 ENCOUNTER — Encounter: Payer: Self-pay | Admitting: Family Medicine

## 2021-12-22 MED ORDER — AMLODIPINE BESYLATE 5 MG PO TABS: 5.0000 mg | ORAL_TABLET | Freq: Every day | ORAL | 0 refills | Status: DC

## 2022-01-26 ENCOUNTER — Ambulatory Visit (INDEPENDENT_AMBULATORY_CARE_PROVIDER_SITE_OTHER): Payer: BC Managed Care – PPO | Admitting: Family Medicine

## 2022-01-26 ENCOUNTER — Encounter: Payer: Self-pay | Admitting: Family Medicine

## 2022-01-26 VITALS — BP 116/84 | HR 80 | Temp 98.2°F | Ht 64.5 in | Wt 218.6 lb

## 2022-01-26 DIAGNOSIS — I10 Essential (primary) hypertension: Secondary | ICD-10-CM | POA: Diagnosis not present

## 2022-01-26 DIAGNOSIS — Z0000 Encounter for general adult medical examination without abnormal findings: Secondary | ICD-10-CM

## 2022-01-26 DIAGNOSIS — M5442 Lumbago with sciatica, left side: Secondary | ICD-10-CM | POA: Diagnosis not present

## 2022-01-26 DIAGNOSIS — Z0001 Encounter for general adult medical examination with abnormal findings: Secondary | ICD-10-CM

## 2022-01-26 DIAGNOSIS — H3581 Retinal edema: Secondary | ICD-10-CM

## 2022-01-26 DIAGNOSIS — R5383 Other fatigue: Secondary | ICD-10-CM | POA: Diagnosis not present

## 2022-01-26 DIAGNOSIS — Z1159 Encounter for screening for other viral diseases: Secondary | ICD-10-CM

## 2022-01-26 LAB — COMPREHENSIVE METABOLIC PANEL
ALT: 10 U/L (ref 0–35)
AST: 24 U/L (ref 0–37)
Albumin: 4.5 g/dL (ref 3.5–5.2)
Alkaline Phosphatase: 47 U/L (ref 39–117)
BUN: 18 mg/dL (ref 6–23)
CO2: 28 mEq/L (ref 19–32)
Calcium: 9.6 mg/dL (ref 8.4–10.5)
Chloride: 102 mEq/L (ref 96–112)
Creatinine, Ser: 0.82 mg/dL (ref 0.40–1.20)
GFR: 89.51 mL/min (ref >60.00)
Glucose, Bld: 82 mg/dL (ref 70–99)
Potassium: 4.3 mEq/L (ref 3.5–5.1)
Sodium: 137 mEq/L (ref 135–145)
Total Bilirubin: 0.6 mg/dL (ref 0.2–1.2)
Total Protein: 7.8 g/dL (ref 6.0–8.3)

## 2022-01-26 LAB — CBC WITH DIFFERENTIAL/PLATELET
Basophils Absolute: 0 10*3/uL (ref 0.0–0.1)
Basophils Relative: 0.7 % (ref 0.0–3.0)
Eosinophils Absolute: 0.1 10*3/uL (ref 0.0–0.7)
Eosinophils Relative: 2.7 % (ref 0.0–5.0)
HCT: 39 % (ref 36.0–46.0)
Hemoglobin: 12.8 g/dL (ref 12.0–15.0)
Lymphocytes Relative: 40 % (ref 12.0–46.0)
Lymphs Abs: 1.7 10*3/uL (ref 0.7–4.0)
MCHC: 32.9 g/dL (ref 30.0–36.0)
MCV: 82.6 fl (ref 78.0–100.0)
Monocytes Absolute: 0.6 10*3/uL (ref 0.1–1.0)
Monocytes Relative: 14.1 % — ABNORMAL HIGH (ref 3.0–12.0)
Neutro Abs: 1.8 10*3/uL (ref 1.4–7.7)
Neutrophils Relative %: 42.5 % — ABNORMAL LOW (ref 43.0–77.0)
Platelets: 310 10*3/uL (ref 150.0–400.0)
RBC: 4.73 Mil/uL (ref 3.87–5.11)
RDW: 14.9 % (ref 11.5–15.5)
WBC: 4.2 10*3/uL (ref 4.0–10.5)

## 2022-01-26 LAB — LIPID PANEL
Cholesterol: 130 mg/dL (ref 0–200)
HDL: 55.6 mg/dL (ref >39.00)
LDL Cholesterol: 65 mg/dL (ref 0–99)
NonHDL: 74.54
Total CHOL/HDL Ratio: 2
Triglycerides: 46 mg/dL (ref 0.0–149.0)
VLDL: 9.2 mg/dL (ref 0.0–40.0)

## 2022-01-26 LAB — VITAMIN D 25 HYDROXY (VIT D DEFICIENCY, FRACTURES): VITD: 34.65 ng/mL (ref 30.00–100.00)

## 2022-01-26 LAB — HEMOGLOBIN A1C: Hgb A1c MFr Bld: 5.9 % (ref 4.6–6.5)

## 2022-01-26 LAB — TSH: TSH: 1.23 u[IU]/mL (ref 0.35–5.50)

## 2022-01-26 LAB — SEDIMENTATION RATE: Sed Rate: 31 mm/hr — ABNORMAL HIGH (ref 0–20)

## 2022-01-26 LAB — T4, FREE: Free T4: 0.84 ng/dL (ref 0.60–1.60)

## 2022-01-26 NOTE — Progress Notes (Signed)
Complete physical exam  Patient: Emily Moss   DOB: 10/30/1980   41 y.o. Female  MRN: [id number]  Subjective:    Chief Complaint  Patient presents with   Annual Exam    Emily Moss is a 40 y.o. female who presents today for a complete physical exam. She reports consuming a general diet. Home exercise routine includes walking and yoga. She generally feels fairly well.  Had recent eye exam.  Cotton-wool spot noted in left eye.  Advised to wear reading glasses and follow-up in 6 months.  Patient endorses intermittent elevation in BP typically when first checks then decreases may have systolic in 726O then 035D.  Patient notes intermittent fatigue at the end of the day.  BS in a.m. low 100s to 1 teens.  Tickle in back now a pain that occurs with movement to the side.  Also with chest pain/soreness in left upper chest and below left breast radiating to left lateral rib cage.  Endorses history of fibroids and constipation which caused hemorrhoids.  Followed by OB/GYN.  Feels achy.  Paternal grandmother with history of lupus.   Most recent fall risk assessment:    01/26/2022    8:41 AM  [place] in the past year? 0  Number falls in past yr: 0  Injury with Fall? 0  Risk for fall due to : No Fall Risks  Follow up Falls evaluation completed     Most recent depression screenings:    01/26/2022    8:41 AM 08/18/2021    2:35 PM  PHQ 2/9 Scores  PHQ - 2 Score 0 0  PHQ- 9 Score 1 0    Vision:Within last year  Patient Active Problem List   Diagnosis Date Noted   Excessive daytime sleepiness 11/30/2021   S/P cesarean section 05/03/2017   Impaired glucose tolerance test 04/05/2017   Cesarean delivery delivered 01/23/2012   Status post primary low transverse cesarean section, with myomectomy 01/22/2012   Obesity 01/20/2012   Gestational diabetes mellitus in pregnancy 11/06/2011   Rh negative state in antepartum period 10/25/2011   Headache(784.0) 08/15/2011   Family  History  Problem Relation Age of Onset   Breast cancer Mother    Diabetes Mother    Hypertension Mother    Cancer Mother 41       BREAST   Drug abuse Sister    Alcohol abuse Maternal Uncle    Alcohol abuse Maternal Grandfather    Alzheimer's disease Maternal Grandfather    Lupus Paternal Grandmother    Cancer Cousin        LUNG/BREAST/LUNG   Stroke Cousin    Drug abuse Cousin    Cancer Cousin        PANCREATIC;COLON   Drug abuse Brother    Asthma Other    No Known Allergies    Patient Care Team: Billie Ruddy, MD as PCP - General (Family Medicine) Berniece Salines, DO as PCP - Cardiology (Cardiology)   Outpatient Medications Prior to Visit  Medication Sig   amLODipine (NORVASC) 5 MG tablet Take 1 tablet (5 mg total) by mouth daily.   cyclobenzaprine (FLEXERIL) 5 MG tablet Take 1 tablet (5 mg total) by mouth 3 (three) times daily as needed for muscle spasms.   pantoprazole (PROTONIX) 20 MG tablet TAKE 1 TABLET(20 MG) BY MOUTH TWICE DAILY   spironolactone (ALDACTONE) 25 MG tablet Take 1 tablet (25 mg total) by mouth daily.   No facility-administered medications prior  to visit.    ROS +fatigue, edema of hands, left sided low back pain, pain in posterior L leg, chest pain/soreness       Objective:     BP 116/84 (BP Location: Left Arm, Patient Position: Sitting, Cuff Size: Large)   Pulse 80   Temp 98.2 F (36.8 C) (Oral)   Ht 5' 4.5" (1.638 m)   Wt 218 lb 9.6 oz (99.2 kg)   LMP 01/07/2022 (Exact Date)   SpO2 98%   BMI 36.94 kg/m    Physical Exam   No results found for any visits on 01/26/22.      Assessment & Plan:    Routine Health Maintenance and Physical Exam  Immunization History  Administered Date(s) Administered   Influenza,inj,Quad PF,6+ Mos 12/06/2016, 12/25/2018, 01/27/2020, 11/30/2020, 11/10/2021   Influenza-Unspecified 12/08/2020   Moderna Sars-Covid-2 Vaccination 12/10/2019   PFIZER(Purple Top)SARS-COV-2 Vaccination 04/08/2019,  05/02/2019, 01/07/2021   Rho (D) Immune Globulin 11/09/2011, 03/07/2017   Tdap 01/21/2012, 03/21/2017, 05/05/2017    Health Maintenance  Topic Date Due   Hepatitis C Screening  Never done   COVID-19 Vaccine (5 - 2023-24 season) 10/08/2021   PAP SMEAR-Modifier  07/01/2024   DTaP/Tdap/Td (4 - Td or Tdap) 05/06/2027   INFLUENZA VACCINE  Completed   HIV Screening  Completed   HPV VACCINES  Aged Out    Discussed health benefits of physical activity, and encouraged her to engage in regular exercise appropriate for her age and condition.  Problem List Items Addressed This Visit   None Visit Diagnoses     Well adult exam    -  Primary   Essential hypertension       Relevant Orders   CMP   TSH   T4, Free   Lipid panel   Acute left-sided low back pain with left-sided sciatica       Encounter for hepatitis C screening test for low risk patient       Relevant Orders   Hep C Antibody   Fatigue, unspecified type       Relevant Orders   CMP   TSH   T4, Free   CBC with Differential/Platelet   Hemoglobin A1c   Lupus (SLE) Analysis   Sedimentation Rate   Vitamin D, 25-hydroxy   Cotton wool spots       Relevant Orders   CMP   TSH   T4, Free       Anticipatory guidance given including wearing seatbelts, smoke detectors in the home, increasing physical activity, increasing p.o. intake of water and vegetables. -labs -mammogram done 10/07/21 -colonoscopy not yet indicated -pap done 07/02/21 -immunizations reviewed -stretching, exercises, topical analgesic, ergonomic modifications for sciatica -continue lifestyle modifications -screen for autoimmune d/o given fam hx of SLE in PGM  F/u prn     Billie Ruddy, MD

## 2022-01-27 LAB — HEPATITIS C ANTIBODY: Hepatitis C Ab: NONREACTIVE

## 2022-02-04 ENCOUNTER — Encounter: Payer: Self-pay | Admitting: Family Medicine

## 2022-02-04 NOTE — Telephone Encounter (Signed)
Spoke to patient. Patient has concerns to why her CBC neutrophil is below the range and monocyte is above the range. If she needs to do anything or worry about it. Inform patient Dr. Volanda Napoleon is aware of the value and is monitoring it. Told patient that her concerns will be forwarded to Dr. Volanda Napoleon. Patient is aware that Dr. Volanda Napoleon is out today and will be back next week.   Please advise.

## 2022-02-05 LAB — LUPUS (SLE) ANALYSIS
Anti Nuclear Antibody (ANA): NEGATIVE
Anti-striation Abs: NEGATIVE
Complement C4, Serum: 33 mg/dL (ref 12–38)
ENA RNP Ab: <0.2 AI (ref 0.0–0.9)
ENA SM Ab Ser-aCnc: <0.2 AI (ref 0.0–0.9)
ENA SSA (RO) Ab: <0.2 AI (ref 0.0–0.9)
ENA SSB (LA) Ab: <0.2 AI (ref 0.0–0.9)
Mitochondrial Ab: <20 Units (ref 0.0–20.0)
Parietal Cell Ab: 37.5 Units — ABNORMAL HIGH (ref 0.0–20.0)
Scleroderma (Scl-70) (ENA) Antibody, IgG: 0.6 AI (ref 0.0–0.9)
Smooth Muscle Ab: 8 Units (ref 0–19)
Thyroperoxidase Ab SerPl-aCnc: 13 IU/mL (ref 0–34)
dsDNA Ab: 7 IU/mL (ref 0–9)

## 2022-02-11 ENCOUNTER — Ambulatory Visit: Payer: No Typology Code available for payment source | Admitting: Family Medicine

## 2022-02-11 VITALS — BP 108/78 | HR 63 | Temp 98.5°F | Wt 221.2 lb

## 2022-02-11 DIAGNOSIS — R21 Rash and other nonspecific skin eruption: Secondary | ICD-10-CM

## 2022-02-11 DIAGNOSIS — R232 Flushing: Secondary | ICD-10-CM

## 2022-02-11 DIAGNOSIS — Z712 Person consulting for explanation of examination or test findings: Secondary | ICD-10-CM

## 2022-02-11 NOTE — Progress Notes (Signed)
   Acute Office Visit  Subjective:     Patient ID: Emily Moss, female    DOB: 09-28-1980, 42 y.o.   MRN: [id number]  Chief Complaint  Patient presents with   Follow-up    Would like to discuss some questions and concerns on lab.    HPI Patient is in today for f/u on recent labs.  Parietal cell ab elevated at 37.5.  Other autoimmune labs negative.    BP at home improving, but still noticing some elevation in the evenings. Endorses feeling flush at times.  Continued palpitations, pain in back and upper chest.  Cards workup was negative.    ROS  ROS negative unless stated above.     Objective:    BP 108/78 (BP Location: Left Arm, Patient Position: Sitting, Cuff Size: Large)   Pulse 63   Temp 98.5 F (36.9 C) (Oral)   Wt 221 lb 3.2 oz (100.3 kg)   LMP 01/07/2022 (Exact Date)   SpO2 98%   BMI 37.38 kg/m    Physical Exam Constitutional:      Appearance: Normal appearance.  HENT:     Head: Normocephalic and atraumatic.     Nose: Nose normal.     Mouth/Throat:     Mouth: Mucous membranes are moist.  Eyes:     Extraocular Movements: Extraocular movements intact.     Conjunctiva/sclera: Conjunctivae normal.     Pupils: Pupils are equal, round, and reactive to light.  Cardiovascular:     Rate and Rhythm: Normal rate and regular rhythm.     Heart sounds: Normal heart sounds.  Pulmonary:     Effort: Pulmonary effort is normal.     Breath sounds: Normal breath sounds.  Skin:    General: Skin is warm and dry.     Findings: Erythema and rash present.     Comments: Faint erythematous rash of b/l cheeks crossing bridge of nose.  Neurological:     General: No focal deficit present.     Mental Status: She is alert and oriented to person, place, and time. Mental status is at baseline.        No results found for any visits on 02/11/22.      Assessment & Plan:   Problem List Items Addressed This Visit   None Visit Diagnoses     Encounter to discuss test  results    -  Primary   Facial rash       Flushing          Lab results reviewed.  Advised elevated parietal cell ab level could be seen in ppl with pernicious anemia or in their relatives.  With pt's ongoing symptoms, faint facial rash, palpitations, gerd symptoms consider causes of symptoms as a whole such as carcinoid syndrome, developing autoimmune d/o, etc.  Consider 24 hr urine 5-HIAA and GI input.    No follow-ups on file.  Billie Ruddy, MD

## 2022-02-26 ENCOUNTER — Encounter: Payer: Self-pay | Admitting: Family Medicine

## 2022-03-21 ENCOUNTER — Other Ambulatory Visit: Payer: Self-pay | Admitting: Family Medicine

## 2022-03-22 MED ORDER — AMLODIPINE BESYLATE 5 MG PO TABS: 5.0000 mg | ORAL_TABLET | Freq: Every day | ORAL | 1 refills | Status: DC

## 2022-04-15 ENCOUNTER — Encounter: Payer: Self-pay | Admitting: Family Medicine

## 2022-04-21 ENCOUNTER — Ambulatory Visit: Payer: No Typology Code available for payment source | Admitting: Family Medicine

## 2022-04-21 ENCOUNTER — Encounter: Payer: Self-pay | Admitting: Family Medicine

## 2022-04-21 VITALS — BP 136/86 | HR 75 | Temp 97.8°F | Ht 64.5 in | Wt 201.8 lb

## 2022-04-21 DIAGNOSIS — I491 Atrial premature depolarization: Secondary | ICD-10-CM | POA: Diagnosis not present

## 2022-04-21 DIAGNOSIS — I493 Ventricular premature depolarization: Secondary | ICD-10-CM

## 2022-04-21 DIAGNOSIS — R002 Palpitations: Secondary | ICD-10-CM | POA: Diagnosis not present

## 2022-04-21 DIAGNOSIS — I10 Essential (primary) hypertension: Secondary | ICD-10-CM

## 2022-04-21 NOTE — Progress Notes (Signed)
   Established Patient Office Visit   Subjective  Patient ID: Emily Moss, female    DOB: [birth year]  Age: 42 y.o. MRN: [id number]  Chief Complaint  Patient presents with   Medical Management of Chronic Issues    Pt is a 42 yo female seen for f/u.  Pt states bp has been better.  Still noticing some elevation in the evening and intermittent palpitations.  Walking 30 mins most days.  When hits the 15 or 16 min mark, notices chest tightness.  Sensation lasts a few mins.  Still with faint rash on cheeks and flushing.  Pt using Optivia, lost 20 lbs.      ROS Negative unless stated above    Objective:     BP 136/86 (BP Location: Left Arm, Patient Position: Sitting, Cuff Size: Normal)   Pulse 75   Temp 97.8 F (36.6 C) (Oral)   Ht 5' 4.5" (1.638 m)   Wt 201 lb 12.8 oz (91.5 kg)   SpO2 99%   BMI 34.10 kg/m    Physical Exam Constitutional:      General: She is not in acute distress.    Appearance: Normal appearance.  HENT:     Head: Normocephalic and atraumatic.     Nose: Nose normal.     Mouth/Throat:     Mouth: Mucous membranes are moist.  Cardiovascular:     Rate and Rhythm: Normal rate and regular rhythm.     Heart sounds: Normal heart sounds. No murmur heard.    No gallop.  Pulmonary:     Effort: Pulmonary effort is normal. No respiratory distress.     Breath sounds: Normal breath sounds. No wheezing, rhonchi or rales.  Skin:    General: Skin is warm and dry.     Comments: Faint erythema.  Neurological:     Mental Status: She is alert and oriented to person, place, and time.      No results found for any visits on 04/21/22.    Assessment & Plan:  Palpitations  Essential hypertension  PAC (premature atrial contraction)  PVC (premature ventricular contraction)  BP controlled.  Continue current meds Norvasc 5 mg and spironolactone 25 mg daily.  Continue follow-up with cardiology for PACs/PV palpitations.  For continued symptoms consider beta-blocker.   For continued/sensation and rash on face consider workup for carcinoid/GI follow-up.  No follow-ups on file.   Deeann Saint, MD

## 2022-08-05 ENCOUNTER — Ambulatory Visit (INDEPENDENT_AMBULATORY_CARE_PROVIDER_SITE_OTHER): Payer: No Typology Code available for payment source | Admitting: Cardiology

## 2022-08-05 ENCOUNTER — Encounter: Payer: Self-pay | Admitting: Cardiology

## 2022-08-05 DIAGNOSIS — I10 Essential (primary) hypertension: Secondary | ICD-10-CM
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
## 2022-08-05 NOTE — Patient Instructions (Signed)
Medication Instructions:  Your physician recommends that you continue on your current medications as directed. Please refer to the Current Medication list given to you today.  *If you need a refill on your cardiac medications before your next appointment, please call your pharmacy*   Lab Work: None   Testing/Procedures: None   Follow-Up: At [facility], you and your health needs are our priority.  As part of our continuing mission to provide you with exceptional heart care, we have created designated Provider Care Teams.  These Care Teams include your primary Cardiologist (physician) and Advanced Practice Providers (APPs -  Physician Assistants and Nurse Practitioners) who all work together to provide you with the care you need, when you need it.   Your next appointment:   1 year(s)  Provider:   Kardie Tobb, DO   

## 2022-08-22 ENCOUNTER — Ambulatory Visit: Payer: No Typology Code available for payment source | Admitting: Family Medicine

## 2022-08-22 ENCOUNTER — Encounter: Payer: Self-pay | Admitting: Family Medicine

## 2022-08-22 VITALS — BP 116/78 | HR 75 | Temp 98.5°F | Wt 206.5 lb

## 2022-08-22 DIAGNOSIS — M542 Cervicalgia: Secondary | ICD-10-CM | POA: Diagnosis not present

## 2022-08-22 DIAGNOSIS — I10 Essential (primary) hypertension: Secondary | ICD-10-CM | POA: Diagnosis not present

## 2022-08-22 DIAGNOSIS — S46811A Strain of other muscles, fascia and tendons at shoulder and upper arm level, right arm, initial encounter: Secondary | ICD-10-CM | POA: Diagnosis not present

## 2022-08-22 NOTE — Progress Notes (Signed)
   Established Patient Office Visit   Subjective  Patient ID: Emily Moss, female    DOB: October 06, 1980  Age: 42 y.o. MRN: [id number]  Chief Complaint  Patient presents with   Neck Pain    Pain on right side of neck from head down to shoulder. Heard a pop while she was sleeping last night. Has not been able to move neck.     Pt is a 42 yr female seen for acute concern.  Patient with right-sided posterior neck pain that started in the middle of the night after rolling over.  Patient states she heard a pop then developed the discomfort.  Patient states neck has been stiff throughout the day.  Has limited range of motion turning to right.  Denies headaches, muscle cramps or spasms, paresthesias or weakness in RUE.  Blood pressure has been great at home.  Spironolactone 25 mg daily and Norvasc 5 mg daily.  Patient no longer having intermittent chest discomfort.  Has not started walking again to see if symptoms would return.  Had recent follow-up with cardiology.      ROS Negative unless stated above    Objective:     BP 116/78 (BP Location: Left Arm, Patient Position: Sitting, Cuff Size: Large)   Pulse 75   Temp 98.5 F (36.9 C) (Oral)   Wt 206 lb 8 oz (93.7 kg)   SpO2 98%   BMI 34.36 kg/m    Physical Exam Constitutional:      General: She is not in acute distress.    Appearance: Normal appearance.  HENT:     Head: Normocephalic and atraumatic.     Nose: Nose normal.     Mouth/Throat:     Mouth: Mucous membranes are moist.  Pulmonary:     Effort: Pulmonary effort is normal. No respiratory distress.  Musculoskeletal:        General: No swelling or tenderness.     Cervical back: Normal.     Thoracic back: Normal.     Lumbar back: Normal.       Back:     Comments: TTP of right trapezius muscle at nuchal line and down cervical portion of trapezius into right shoulder.  No TTP of midline cervical spine, thoracic spine, or lumbar spine.  Skin:    General: Skin is warm  and dry.  Neurological:     Mental Status: She is alert and oriented to person, place, and time. Mental status is at baseline.      No results found for any visits on 08/22/22.    Assessment & Plan:  Acute neck pain -Due to acute strain of right cervical portion of trapezius muscle from rolling over in bed -No red flag s/s -Discussed supportive care including stretching, massage, heat, ice, topical analgesics, NSAIDs/Tylenol as needed -Also discussed muscle relaxer.  Patient has left over Flexeril from an injury few months ago.  Will use nightly as needed.  Strain of right trapezius muscle, initial encounter  Essential hypertension -well controlled -continue current meds spironolactone 25 mg daily and Norvasc 5 mg daily -Continue lifestyle modifications -Continue follow-up with cardiology yearly   Return if symptoms worsen or fail to improve.   Deeann Saint, MD

## 2022-09-12 ENCOUNTER — Other Ambulatory Visit: Payer: Self-pay | Admitting: Diagnostic Radiology

## 2022-09-12 DIAGNOSIS — Z1231 Encounter for screening mammogram for malignant neoplasm of breast: Secondary | ICD-10-CM

## 2022-09-21 ENCOUNTER — Encounter: Payer: Self-pay | Admitting: Family Medicine

## 2022-09-21 MED ORDER — AMLODIPINE BESYLATE 5 MG PO TABS: 5.0000 mg | ORAL_TABLET | Freq: Every day | ORAL | 1 refills | Status: DC

## 2022-10-07 ENCOUNTER — Other Ambulatory Visit: Payer: Self-pay | Admitting: Obstetrics and Gynecology

## 2022-10-07 DIAGNOSIS — Z1231 Encounter for screening mammogram for malignant neoplasm of breast: Secondary | ICD-10-CM

## 2022-10-11 ENCOUNTER — Ambulatory Visit
Admission: RE | Admit: 2022-10-11 | Discharge: 2022-10-11 | Disposition: A | Discharge status: Home / Self Care | Payer: No Typology Code available for payment source | Source: Ambulatory Visit

## 2022-10-11 DIAGNOSIS — Z1231 Encounter for screening mammogram for malignant neoplasm of breast: Secondary | ICD-10-CM

## 2022-11-07 ENCOUNTER — Encounter: Payer: Self-pay | Admitting: Family Medicine

## 2022-11-07 DIAGNOSIS — I10 Essential (primary) hypertension: Secondary | ICD-10-CM

## 2022-11-07 MED ORDER — SPIRONOLACTONE 25 MG PO TABS: 25.0000 mg | ORAL_TABLET | Freq: Every day | ORAL | 3 refills | Status: DC

## 2022-11-23 ENCOUNTER — Ambulatory Visit: Payer: No Typology Code available for payment source | Admitting: Pulmonary Disease

## 2022-11-23 ENCOUNTER — Encounter: Payer: Self-pay | Admitting: Pulmonary Disease

## 2022-11-23 VITALS — BP 120/80 | HR 86 | Ht 65.0 in | Wt 209.4 lb

## 2022-11-23 DIAGNOSIS — G478 Other sleep disorders: Secondary | ICD-10-CM
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
## 2022-11-23 NOTE — Patient Instructions (Signed)
I will see you a year from now  Ensure you are getting enough hours of sleep  Graded activities as tolerated  Call us with significant concerns

## 2022-12-18 IMAGING — DX DG KNEE COMPLETE 4+V*L*
4 series · 4 of 4 positions shown · non-contrast
Comparison: None.

CLINICAL DATA: Left knee pain and edema for weeks. Intermittent,
now constant.

EXAM:
LEFT KNEE - COMPLETE 4+ VIEW

[knee ap]
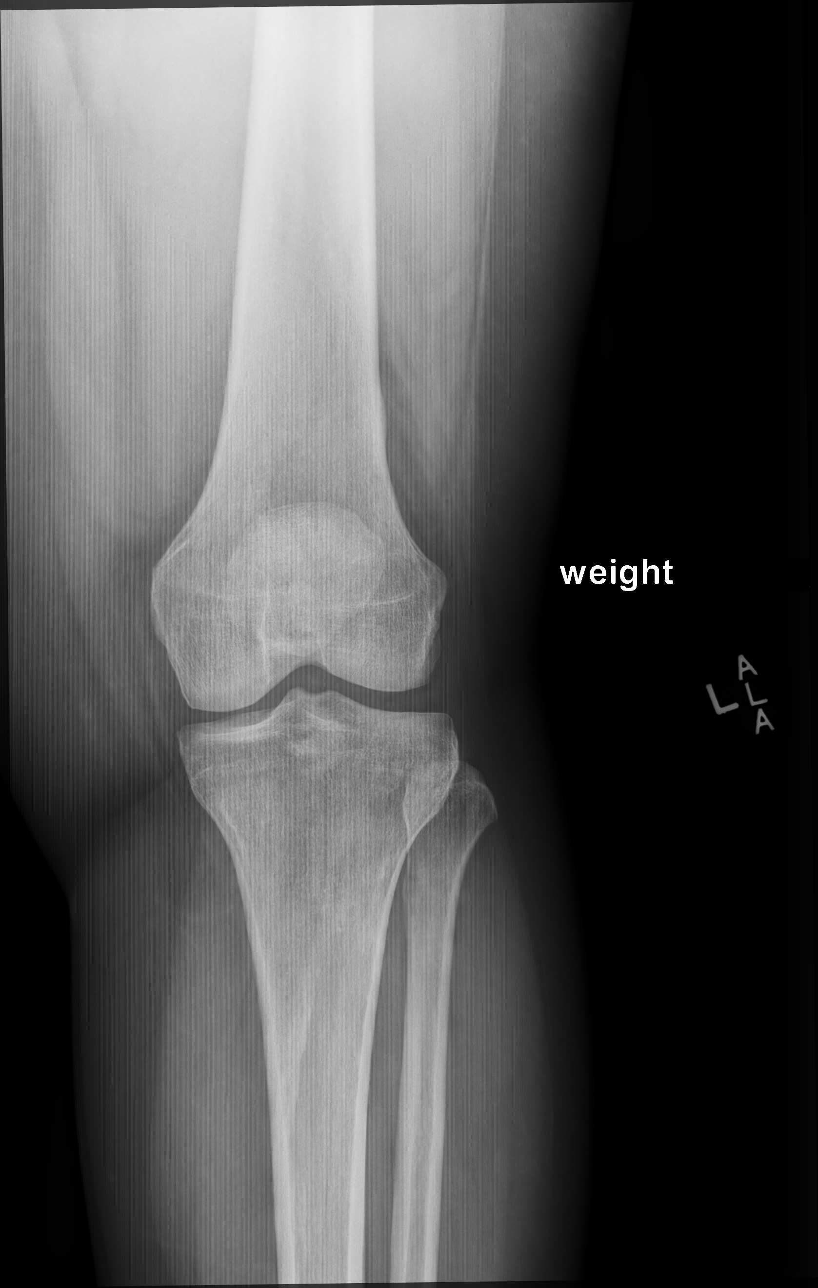

[knee [person_name] view pa]
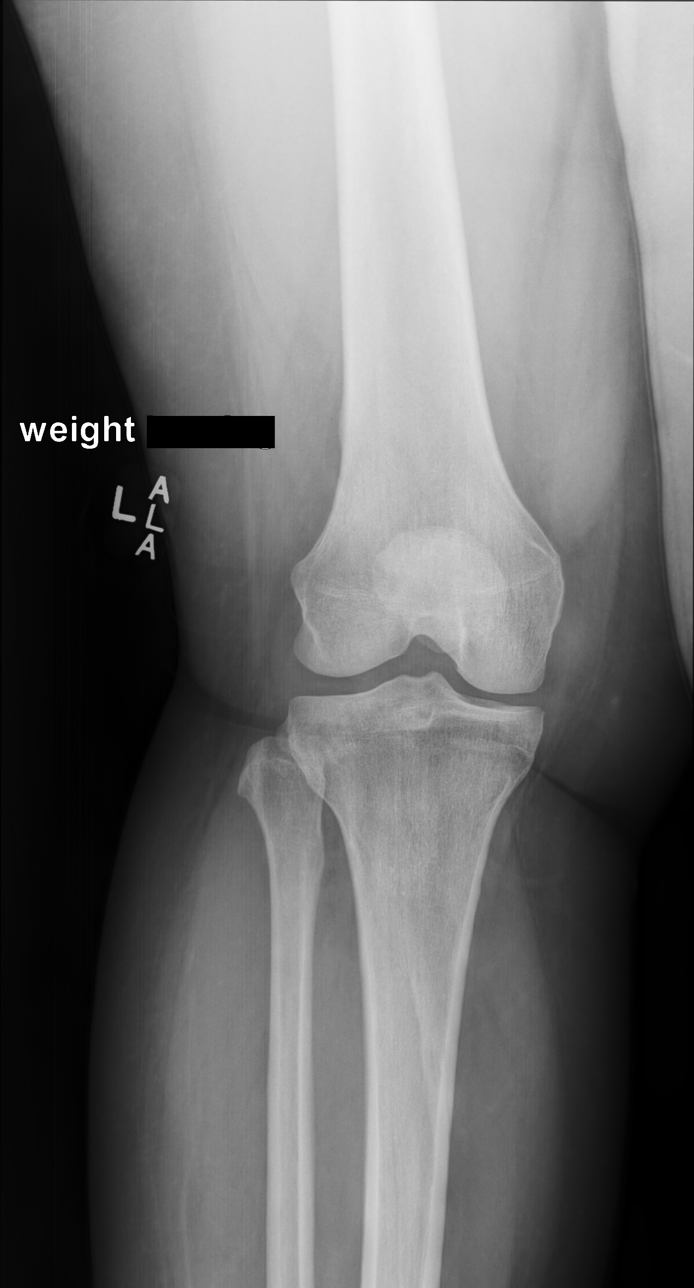

[patella (sunrise) tan]
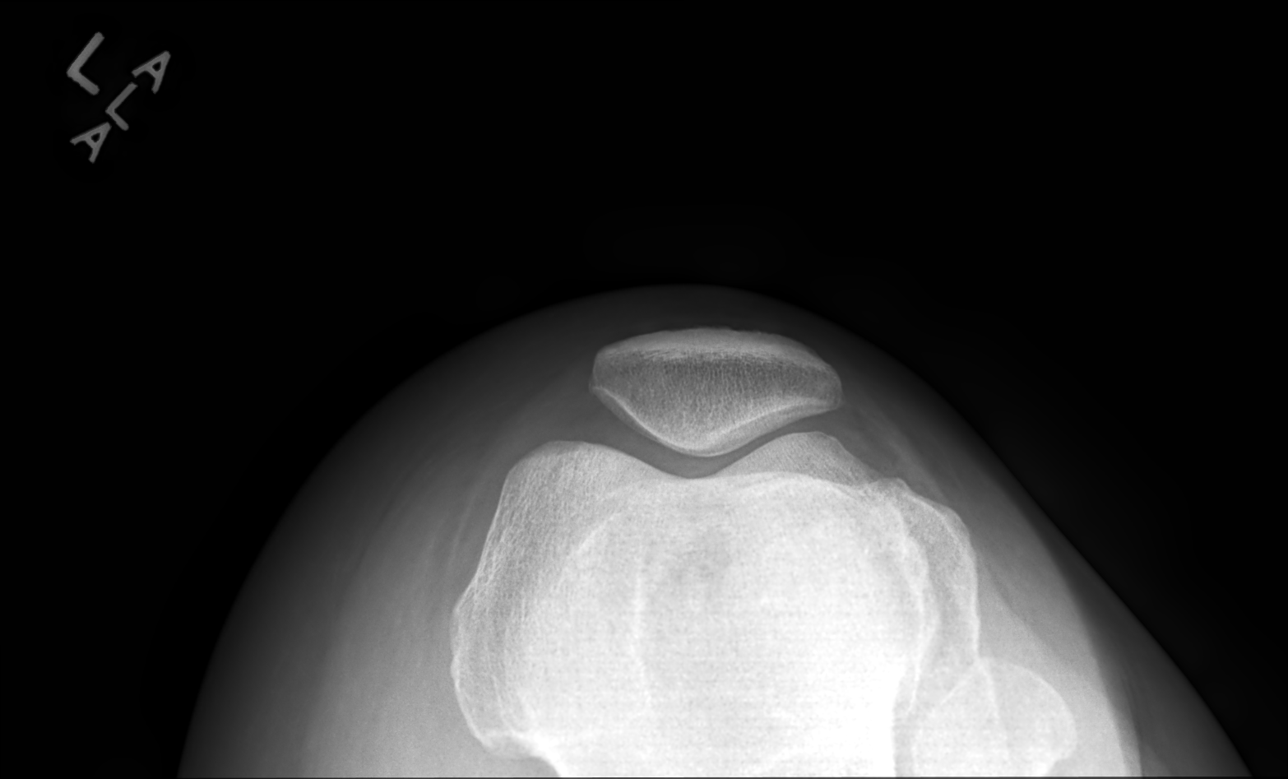

[knee lat]
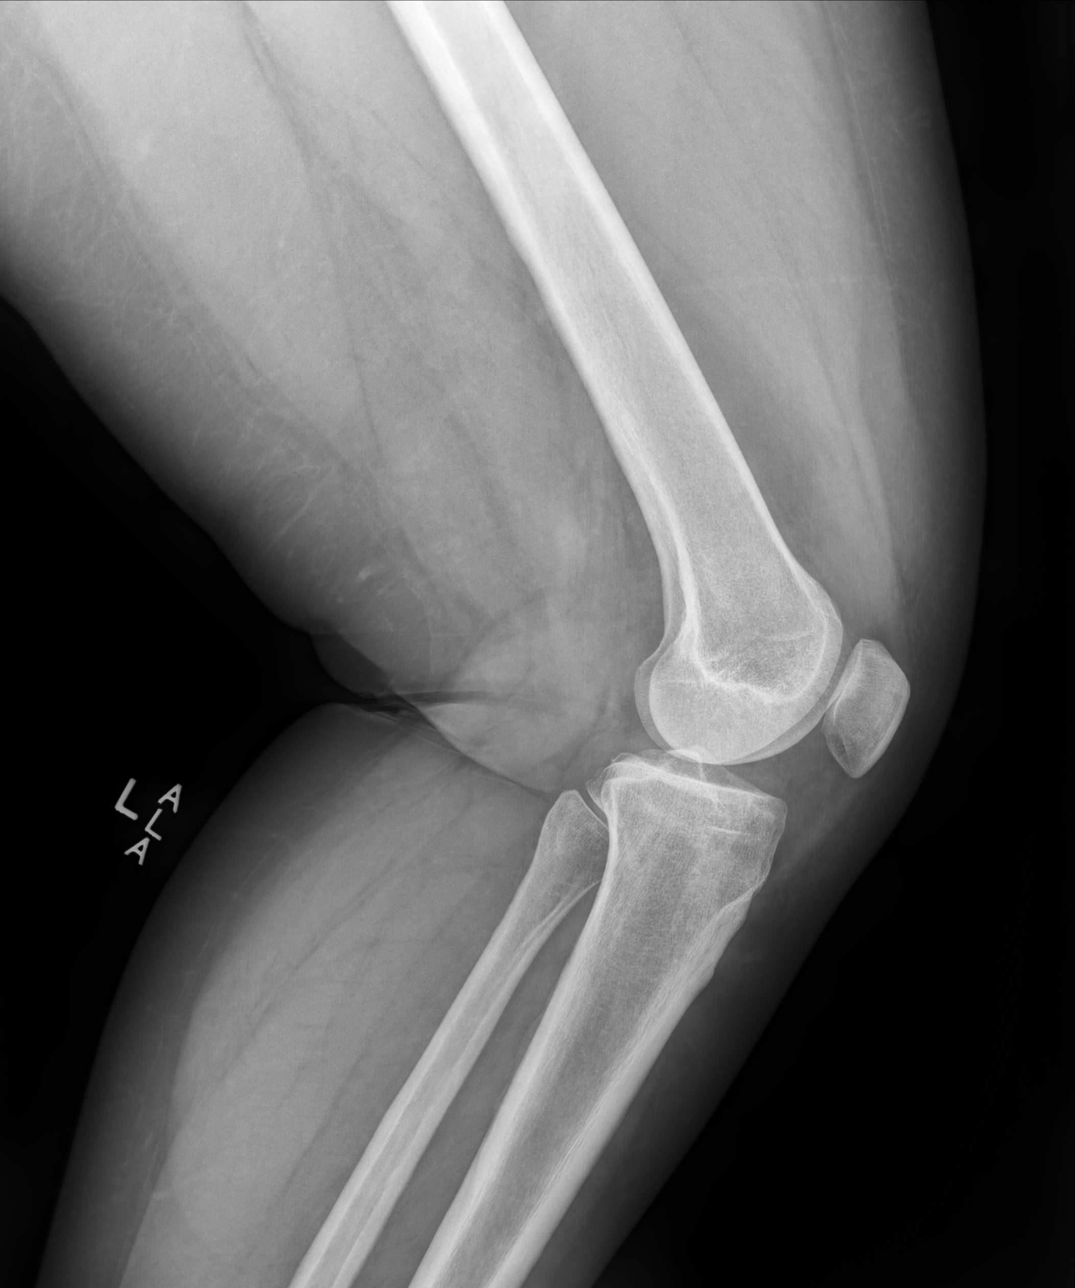

[4 of 4 positions shown; findings below may reference images not displayed]

FINDINGS: No evidence of fracture or dislocation. Normal joint spaces. Normal
alignment. There is a small knee joint effusion. No evidence of
arthropathy or other focal bone abnormality. Soft tissues are
unremarkable.
IMPRESSION: Small knee joint effusion. No osseous abnormality.

## 2023-03-01 ENCOUNTER — Ambulatory Visit: Payer: Self-pay | Admitting: Family Medicine

## 2023-03-01 ENCOUNTER — Encounter: Payer: No Typology Code available for payment source | Admitting: Family Medicine

## 2023-03-01 DIAGNOSIS — E559 Vitamin D deficiency, unspecified: Secondary | ICD-10-CM

## 2023-03-01 DIAGNOSIS — Z0000 Encounter for general adult medical examination without abnormal findings: Secondary | ICD-10-CM

## 2023-03-01 DIAGNOSIS — I10 Essential (primary) hypertension: Secondary | ICD-10-CM

## 2023-03-01 NOTE — Telephone Encounter (Signed)
Copied from CRM [id number]. Topic: Clinical - Red Word Triage >> Mar 01, 2023  8:02 AM Marica Otter wrote: [facility] that prompted transfer to Nurse Triage: Patient originally called to reschedule appointment going through decision tree patient states symptoms worsening. Patient refused being transferred stating it's not that serious, patient reported a tingly pain in her back almost like someone is running their finger up her back. I explained the process to patient based off the symptoms provided and she stated she just wanted to be scheduled with provider.   Chief Complaint: back pain, chest pain Symptoms: chest pain to left side and middle chest, back pain to middle left back Frequency: intermittent Pertinent Negatives: Patient denies current chest pain, current back pain, SOB, nausea, vomiting, dizziness, weakness, heart racing, headache, vision changes, sour taste in mouth, heartburn Disposition: [] 911 / [] ED /[] Urgent Care (no appt availability in office) / [] Appointment(In office/virtual)/ []  [facility]/ [] Home Care/ [x] Refused Recommended Disposition /[] [facility]/ []  Follow-up with PCP Additional Notes: Pt is poor historian. Pt reporting that her back pain "used to be like a tingle kind of," feels pain in middle left back. Pt also reporting "aching right in middle of body, under breast" between breasts near bottom of sternum "can't point to it," feels like "dull ache, uncomfortable pain" and pain "went down to my left, towards left breast and underneath rib cage, also pain on top of breast by collarbone, off to left side, don't know if related at all, not bad pain just really annoying." Pt confirms pain comes and goes and lasts few seconds, occurs at "random" but comes on with certain movements and sometimes with deep breaths, "when twist and turn and when touch it." Pt reporting hx of high BP, denies headache, vision changes, heart racing, SOB, or other cardiac symptoms. Pt  confirms took BP med on time, confirms no pain at this time, pt confirms pain coming on "more frequent lately," been given "muscle relaxers" in past for this pain but "not improving." Advised pt be examined today, no availability with PCP today, offered schedule with other office or UC, pt refusing, pt requesting appt tomorrow with PCP. Advised again that pt be examined today, pt stating "I don't know." Advised pt go to ED if symptoms return or worsen. Pt verbalized understanding.  Reason for Disposition  [1] Chest pain lasts < 5 minutes AND [2] NO chest pain or cardiac symptoms (e.g., breathing difficulty, sweating) now  (Exception: Chest pains that last only a few seconds.)  Answer Assessment - Initial Assessment Questions 1. LOCATION: "Where does it hurt?"       Aching right in middle of body between breasts under breast dull ache uncomfortable pain sometimes 2. RADIATION: "Does the pain go anywhere else?" (e.g., into neck, jaw, arms, back)     Went down to my left, towards left breast and underneath rib cage Also pain on top of breast by collarbone, off to left side, don't know if related at all, not bad pain just really annoying, Pain in back to middle left 3. ONSET: "When did the chest pain begin?" (Minutes, hours or days)      It's been a while, maybe a year and some change 4. PATTERN: "Does the pain come and go, or has it been constant since it started?"  "Does it get worse with exertion?"      Comes and goes, with movement, even when breathe sometimes I feel it, random, sometimes don't feel it whole lot  at all, on average maybe 3 or 4 times maybe 5. DURATION: "How long does it last" (e.g., seconds, minutes, hours)     Few seconds, especially if moving certain way, sometimes sits there for few seconds then goes away 6. SEVERITY: "How bad is the pain?"  (e.g., Scale 1-10; mild, moderate, or severe)    - MILD (1-3): doesn't interfere with normal activities     - MODERATE (4-7): interferes  with normal activities or awakens from sleep    - SEVERE (8-10): excruciating pain, unable to do any normal activities       2, 3, 4 just something doesn't feel right 7. CARDIAC RISK FACTORS: "Do you have any history of heart problems or risk factors for heart disease?" (e.g., angina, prior heart attack; diabetes, high blood pressure, high cholesterol, smoker, or strong family history of heart disease)     High BP, on BP meds currently, Family has high BP, maternal grandmother died of heart complication, pt think heart murmur as baby, 8. PULMONARY RISK FACTORS: "Do you have any history of lung disease?"  (e.g., blood clots in lung, asthma, emphysema, birth control pills)     Asthma as kid 9. CAUSE: "What do you think is causing the chest pain?"     Couldn't tell ya 10. OTHER SYMPTOMS: "Do you have any other symptoms?" (e.g., dizziness, nausea, vomiting, sweating, fever, difficulty breathing, cough)       No headache, Sometimes feel hoarse in throat maybe silent reflux 11. PREGNANCY: "Is there any chance you are pregnant?" "When was your last menstrual period?"       denies  Protocols used: Chest Pain-A-AH

## 2023-03-01 NOTE — Telephone Encounter (Signed)
Spoke to pt.   Pt reports the pain is "infrequently" and that the sx she is having is on going for a year. Pt states "honestly I'm fine, not feeling pain right now".   Pt has appt on 03/16/2023 for physical and lab work. Pt would like to get that done early as it is for her job. She would like to do physical and discuss with provider about her pain.   Inform pt her insurance would not cover for both and that she would get a bill. Inform pt we can schedule her an earlier appt with whichever concerns that is more of high priority for her. Advise pt, we can schedule an appt to discuss about the pain and she can discuss with provider if provider can order blood work at the appt.   Pt verbalized understanding.    Forwarding to provider for FYI.

## 2023-03-02 ENCOUNTER — Encounter: Payer: Self-pay | Admitting: Family Medicine

## 2023-03-02 ENCOUNTER — Ambulatory Visit: Payer: No Typology Code available for payment source | Admitting: Family Medicine

## 2023-03-02 VITALS — BP 120/80 | HR 72 | Temp 97.9°F | Ht 65.0 in | Wt 212.6 lb

## 2023-03-02 DIAGNOSIS — R1013 Epigastric pain: Secondary | ICD-10-CM

## 2023-03-02 DIAGNOSIS — I10 Essential (primary) hypertension: Secondary | ICD-10-CM

## 2023-03-02 DIAGNOSIS — Z0000 Encounter for general adult medical examination without abnormal findings: Secondary | ICD-10-CM | POA: Diagnosis not present

## 2023-03-02 LAB — LIPASE: Lipase: 10 U/L — ABNORMAL LOW (ref 11.0–59.0)

## 2023-03-02 MED ORDER — AMLODIPINE BESYLATE 5 MG PO TABS: 5.0000 mg | ORAL_TABLET | Freq: Every day | ORAL | 3 refills | Status: DC

## 2023-03-02 NOTE — Telephone Encounter (Signed)
Pt seen today in clinic, 03/02/23.

## 2023-03-02 NOTE — Patient Instructions (Signed)
A referral to the gastroenterologist was placed for your epigastric pain.  You should expect a phone call about setting up this appointment.

## 2023-03-02 NOTE — Progress Notes (Signed)
Established Patient Office Visit   Subjective  Patient ID: Emily Moss, female    DOB: December 15, 1980  Age: 43 y.o. MRN: [id number]  Chief Complaint  Patient presents with   Medical Management of Chronic Issues    Back pain and top of the stomach pain     Pt is a 43 yo female seen for CPE and ongoing concern.  Patient scheduled yesterday for CPE however appointment rescheduled to today due to weather.  Pt still having intermittent sensation, not quite a pulling underneath left lower medial edge of scapula.  Sensation now becoming of pain.  Feels better with stretching.  Patient unsure if related to posture.  Patient also notes epigastric discomfort.  No association with food noticed.  In the past tried Protonix but stopped as caused patient to feel funny.  Patient taking Norvasc 5 mg daily and spironolactone 25 mg daily for BP.    Patient Active Problem List   Diagnosis Date Noted   Excessive daytime sleepiness 11/30/2021   S/P cesarean section 05/03/2017   Impaired glucose tolerance test 04/05/2017   Cesarean delivery delivered 01/23/2012   Status post primary low transverse cesarean section, with myomectomy 01/22/2012   Obesity 01/20/2012   Gestational diabetes mellitus in pregnancy 11/06/2011   Rh negative state in antepartum period 10/25/2011   Headache 08/15/2011   Past Medical History:  Diagnosis Date   Abnormal Pap smear 2009   LAST PAP 05/2011   Asthma    CHILDHOOD   Diabetes mellitus    gestational- diet controlled   Eczema    Fibroid 2010   Gestational diabetes    H/O varicella    Headache(784.0)    one severe headache during pregnancy    Heart murmur    AT BIRTH RESOLVED   Heartburn in pregnancy    Infection    UTI X 1   Infection 2000, 2002,2008   CHLAMYDIA   Recurrent upper respiratory infection (URI)    RECURRENT BRONCHITIS   Past Surgical History:  Procedure Laterality Date   CESAREAN SECTION  01/20/2012   Procedure: CESAREAN SECTION;  Surgeon:  Hal Morales, MD;  Location: [facility];  Service: Obstetrics;  Laterality: N/A;   CESAREAN SECTION N/A 05/03/2017   Procedure: CESAREAN SECTION;  Surgeon: Gerald Leitz, MD;  Location: [facility]IRTH[facility] SUIT[facility];  Service: Obstetrics;  Laterality: N/A;  EDD 05/31/17   NO PAST SURGERIES     WISDOM TOOTH EXTRACTION  2011   x 2   Social History   Tobacco Use   Smoking status: Never   Smokeless tobacco: Never  Substance Use Topics   Alcohol use: No   Drug use: No   Family History  Problem Relation Age of Onset   Breast cancer Mother    Diabetes Mother    Hypertension Mother    Cancer Mother 60       BREAST   Drug abuse Sister    Alcohol abuse Maternal Uncle    Alcohol abuse Maternal Grandfather    Alzheimer's disease Maternal Grandfather    Lupus Paternal Grandmother    Cancer Cousin        LUNG/BREAST/LUNG   Stroke Cousin    Drug abuse Cousin    Cancer Cousin        PANCREATIC;COLON   Drug abuse Brother    Asthma Other    No Known Allergies    ROS Negative unless stated above    Objective:     BP 120/80 (BP Location:  Left Arm, Patient Position: Sitting, Cuff Size: Large)   Pulse 72   Temp 97.9 F (36.6 C) (Oral)   Ht 5\' 5"  (1.651 m)   Wt 212 lb 9.6 oz (96.4 kg)   LMP 02/15/2023 (Exact Date)   SpO2 97%   BMI 35.38 kg/m  BP Readings from Last 3 Encounters:  03/02/23 120/80  11/23/22 120/80  08/22/22 116/78   Wt Readings from Last 3 Encounters:  03/02/23 212 lb 9.6 oz (96.4 kg)  11/23/22 209 lb 6.4 oz (95 kg)  08/22/22 206 lb 8 oz (93.7 kg)      Physical Exam Constitutional:      Appearance: Normal appearance.  HENT:     Head: Normocephalic and atraumatic.     Right Ear: Tympanic membrane, ear canal and external ear normal.     Left Ear: Tympanic membrane, ear canal and external ear normal.     Nose: Nose normal.     Mouth/Throat:     Mouth: Mucous membranes are moist.     Pharynx: No oropharyngeal exudate or posterior oropharyngeal erythema.  Eyes:      General: No scleral icterus.    Extraocular Movements: Extraocular movements intact.     Conjunctiva/sclera: Conjunctivae normal.     Pupils: Pupils are equal, round, and reactive to light.  Neck:     Thyroid: No thyromegaly.  Cardiovascular:     Rate and Rhythm: Normal rate and regular rhythm.     Pulses: Normal pulses.     Heart sounds: Normal heart sounds. No murmur heard.    No friction rub.  Pulmonary:     Effort: Pulmonary effort is normal.     Breath sounds: Normal breath sounds. No wheezing, rhonchi or rales.  Abdominal:     General: Bowel sounds are normal.     Palpations: Abdomen is soft.     Tenderness: There is no abdominal tenderness.  Musculoskeletal:        General: No deformity. Normal range of motion.  Lymphadenopathy:     Cervical: No cervical adenopathy.  Skin:    General: Skin is warm and dry.     Findings: No lesion.  Neurological:     General: No focal deficit present.     Mental Status: She is alert and oriented to person, place, and time.  Psychiatric:        Mood and Affect: Mood normal.        Thought Content: Thought content normal.      No results found for any visits on 03/02/23.    Assessment & Plan:  Well adult exam -     CMP12+LP+TP+TSH+5AC+CBC/D/P...  Epigastric pain -     Ambulatory referral to Gastroenterology -     CMP12+LP+TP+TSH+5AC+CBC/D/P.Marland Kitchen. -     Lipase  Essential hypertension -     amLODIPine Besylate; Take 1 tablet (5 mg total) by mouth daily.  Dispense: 90 tablet; Refill: 3 -     CMP12+LP+TP+TSH+5AC+CBC/D/P...  Age-appropriate health screenings discussed.  Will obtain labs as required for patient's job.  Immunizations reviewed.  Pap with OB/GYN.  BP well-controlled.  Continue Norvasc 5 mg and spironolactone 25 mg daily for BP.  Continue lifestyle modifications.  Patient to keep food diary as it relates to epigastric pain symptoms.  Referral to gastroenterology placed for possible EGD as PPI ineffective in the past.  Given  strict precautions.  Return in about 6 weeks (around 04/13/2023), or if symptoms worsen or fail to improve.   Bettey Mare  Salomon Fick, MD

## 2023-03-03 ENCOUNTER — Encounter: Payer: No Typology Code available for payment source | Admitting: Family Medicine

## 2023-03-03 LAB — CMP12+LP+TP+TSH+5AC+CBC/D/P...
ALT: 9 [IU]/L (ref 0–32)
AST: 22 [IU]/L (ref 0–40)
Albumin: 4.6 g/dL (ref 3.9–4.9)
Alkaline Phosphatase: 56 [IU]/L (ref 44–121)
BUN/Creatinine Ratio: 20 (ref 9–23)
BUN: 16 mg/dL (ref 6–24)
Basophils Absolute: 0 10*3/uL (ref 0.0–0.2)
Basos: 1 %
Bilirubin Total: 0.4 mg/dL (ref 0.0–1.2)
Calcium: 9.7 mg/dL (ref 8.7–10.2)
Chloride: 99 mmol/L (ref 96–106)
Chol/HDL Ratio: 2.4 ratio (ref 0.0–4.4)
Cholesterol, Total: 144 mg/dL (ref 100–199)
Creatinine, Ser: 0.8 mg/dL (ref 0.57–1.00)
EOS (ABSOLUTE): 0.1 10*3/uL (ref 0.0–0.4)
Eos: 1 %
Free Thyroxine Index: 3.1 (ref 1.2–4.9)
GGT: 11 [IU]/L (ref 0–60)
Globulin, Total: 2.9 g/dL (ref 1.5–4.5)
Glucose: 89 mg/dL (ref 70–99)
HDL: 60 mg/dL
Hematocrit: 44 % (ref 34.0–46.6)
Hemoglobin: 13.3 g/dL (ref 11.1–15.9)
Immature Grans (Abs): 0 10*3/uL (ref 0.0–0.1)
Immature Granulocytes: 0 %
LDH: 180 [IU]/L (ref 119–226)
LDL Chol Calc (NIH): 72 mg/dL (ref 0–99)
LDL/HDL Ratio: 1.2 ratio (ref 0.0–3.2)
Lymphocytes Absolute: 1.8 10*3/uL (ref 0.7–3.1)
Lymphs: 43 %
MCH: 26.6 pg (ref 26.6–33.0)
MCHC: 30.2 g/dL — ABNORMAL LOW (ref 31.5–35.7)
MCV: 88 fL (ref 79–97)
Monocytes Absolute: 0.4 10*3/uL (ref 0.1–0.9)
Monocytes: 11 %
Neutrophils Absolute: 1.8 10*3/uL (ref 1.4–7.0)
Neutrophils: 44 %
Phosphorus: 3.5 mg/dL (ref 3.0–4.3)
Platelets: 298 10*3/uL (ref 150–450)
Potassium: 4.6 mmol/L (ref 3.5–5.2)
RBC: 5 x10E6/uL (ref 3.77–5.28)
RDW: 12.9 % (ref 11.7–15.4)
Sodium: 137 mmol/L (ref 134–144)
T3 Uptake Ratio: 34 % (ref 24–39)
T4, Total: 9 ug/dL (ref 4.5–12.0)
TSH: 1.31 u[IU]/mL (ref 0.450–4.500)
Total Protein: 7.5 g/dL (ref 6.0–8.5)
Triglycerides: 54 mg/dL (ref 0–149)
Uric Acid: 3.9 mg/dL (ref 2.6–6.2)
VLDL Cholesterol Cal: 12 mg/dL (ref 5–40)
WBC: 4.2 10*3/uL (ref 3.4–10.8)
eGFR: 95 mL/min/{1.73_m2}

## 2023-03-09 ENCOUNTER — Encounter: Payer: Self-pay | Admitting: Family Medicine

## 2023-03-16 ENCOUNTER — Encounter: Payer: No Typology Code available for payment source | Admitting: Family Medicine

## 2023-03-20 NOTE — Progress Notes (Signed)
Chief Complaint: Epigastric pain Primary GI Doctor: Dr. Myrtie Neither  HPI: Patient is a 43 year old female patient with past medical history of Hypertension, Prediabetes, fibroids, and Obesity, who was referred to me by Deeann Saint, MD on 03/02/23 for a complaint of epigastric pain.   Interval History    Patient presents with main complaint of intermittent epigastric pain that occurs most days, will last few minutes then resolves on own. Epigastric pain with and without eating. She also has left upper back pain, she is unsure if they related or even if they occur simultaneously. She does not do any heavy lifting or engage in any physical activity to explain the pain. She does report it seems to be worse when driving in the car and when lying flat in bed. She does have intermittent regurgitation. No pyrosis but upper chest discomfort. Reports she has had full evaluation by cardiology.  She reports her throat also feels "raw", but not painful. Denies dysphagia. She took Pantoprazole 40 mg po daily for several weeks with no improvement and states it actually made the symptoms worse. She takes OTC Tums which she thinks helps some. Patient denies nausea and vomiting. She has recently made dietary changes for weight loss. She is consuming more protein and vegetables. She has not noticed any difference in epigastric pain with dietary changes. She reports one regular bowel movement daily. No tarry stool. She has more gas if she eats certain foods such as onions. No blood in stool. No new medications. Nonsmoker. No alcohol use. Occasional Advil prn very seldom.  Never had EGD or colonoscopy. Patient's family history includes mother with breast CA. She does endorse a lot of stress over the course of the last few years with her mothers health issues and her own health issues.   Wt Readings from Last 3 Encounters:  03/21/23 212 lb (96.2 kg)  03/02/23 212 lb 9.6 oz (96.4 kg)  11/23/22 209 lb 6.4 oz (95 kg)    Past  Medical History:  Diagnosis Date   Abnormal Pap smear 2009   LAST PAP 05/2011   Asthma    CHILDHOOD   Diabetes mellitus    gestational- diet controlled   Eczema    Fibroid 2010   Gestational diabetes    H/O varicella    Headache(784.0)    one severe headache during pregnancy    Heart murmur    AT BIRTH RESOLVED   Heartburn in pregnancy    Infection    UTI X 1   Infection 2000, 2002,2008   CHLAMYDIA   Recurrent upper respiratory infection (URI)    RECURRENT BRONCHITIS    Past Surgical History:  Procedure Laterality Date   CESAREAN SECTION  01/20/2012   Procedure: CESAREAN SECTION;  Surgeon: Hal Morales, MD;  Location: [facility];  Service: Obstetrics;  Laterality: N/A;   CESAREAN SECTION N/A 05/03/2017   Procedure: CESAREAN SECTION;  Surgeon: Gerald Leitz, MD;  Location: [facility]IRTH[facility] SUIT[facility];  Service: Obstetrics;  Laterality: N/A;  EDD 05/31/17   NO PAST SURGERIES     WISDOM TOOTH EXTRACTION  2011   x 2    Current Outpatient Medications  Medication Sig Dispense Refill   amLODipine (NORVASC) 5 MG tablet Take 1 tablet (5 mg total) by mouth daily. 90 tablet 3   Multiple Vitamin (MULTIVITAMIN WITH MINERALS) TABS tablet Take 1 tablet by mouth daily.     spironolactone (ALDACTONE) 25 MG tablet Take 1 tablet (25 mg total) by mouth daily.  90 tablet 3   No current facility-administered medications for this visit.    Allergies as of 03/21/2023   (No Known Allergies)    Family History  Problem Relation Age of Onset   Breast cancer Mother    Diabetes Mother    Hypertension Mother    Cancer Mother 44       BREAST   Drug abuse Sister    Alcohol abuse Maternal Uncle    Alcohol abuse Maternal Grandfather    Alzheimer's disease Maternal Grandfather    Lupus Paternal Grandmother    Cancer Cousin        LUNG/BREAST/LUNG   Stroke Cousin    Drug abuse Cousin    Cancer Cousin        PANCREATIC;COLON   Drug abuse Brother    Asthma Other    Review of Systems:     Constitutional: No weight loss, fever, chills, weakness or fatigue HEENT: Eyes: No change in vision               Ears, Nose, Throat:  No change in hearing or congestion Skin: No rash or itching Cardiovascular: No chest pain, chest pressure or palpitations   Respiratory: No SOB or cough Gastrointestinal: See HPI and otherwise negative Genitourinary: No dysuria or change in urinary frequency Neurological: No headache, dizziness or syncope Musculoskeletal: No new muscle or joint pain Hematologic: No bleeding or bruising Psychiatric: No history of depression or anxiety   Physical Exam:  Vital signs: BP 112/64   Pulse 71   Ht 5\' 5"  (1.651 m)   Wt 212 lb (96.2 kg)   LMP 02/15/2023 (Exact Date)   BMI 35.28 kg/m   Constitutional:  Pleasant female appears to be in NAD, Well developed, Well nourished, alert and cooperative Throat: Oral cavity and pharynx without inflammation, swelling or lesion.  Respiratory: Respirations even and unlabored. Lungs clear to auscultation bilaterally.   No wheezes, crackles, or rhonchi.  Cardiovascular: Normal S1, S2. Regular rate and rhythm. No peripheral edema, cyanosis or pallor.  Gastrointestinal:  Soft, nondistended, epigastric tenderness with palpation. No rebound or guarding. Normal bowel sounds. No appreciable masses or hepatomegaly. Rectal:  Not performed.  Msk:  Symmetrical without gross deformities. Without edema, no deformity or joint abnormality.  Neurologic:  Alert and  oriented x4;  grossly normal neurologically.  Skin:   Dry and intact without significant lesions or rashes. Psychiatric: Oriented to person, place and time. Demonstrates good judgement and reason without abnormal affect or behaviors.  RELEVANT LABS AND IMAGING: CBC    Latest Ref Rng & Units 03/02/2023   10:34 AM 01/26/2022    9:38 AM 10/26/2021   12:00 AM  CBC  WBC 3.4 - 10.8 x10E3/uL 4.2  4.2  4.2      Hemoglobin 11.1 - 15.9 g/dL 40.9  81.1  91.4      Hematocrit 34.0 -  46.6 % 44.0  39.0  41      Platelets 150 - 450 x10E3/uL 298  310.0       This result is from an external source.     CMP     Latest Ref Rng & Units 03/02/2023   10:34 AM 01/26/2022    9:38 AM 10/26/2021   12:00 AM  CMP  Glucose 70 - 99 mg/dL 89  82    BUN 6 - 24 mg/dL 16  18  15       Creatinine 0.57 - 1.00 mg/dL 7.82  9.56  0.8  Sodium 134 - 144 mmol/L 137  137  139      Potassium 3.5 - 5.2 mmol/L 4.6  4.3  4.5      Chloride 96 - 106 mmol/L 99  102  101      CO2 19 - 32 mEq/L  28    Calcium 8.7 - 10.2 mg/dL 9.7  9.6  9.5      Total Protein 6.0 - 8.5 g/dL 7.5  7.8    Total Bilirubin 0.0 - 1.2 mg/dL 0.4  0.6    Alkaline Phos 44 - 121 IU/L 56  47  55      AST 0 - 40 IU/L 22  24  22       ALT 0 - 32 IU/L 9  10  8          This result is from an external source.  03/02/23 lipase 10  Lab Results  Component Value Date   TSH 1.310 03/02/2023    03/22/21 echo- Left ventricular ejection fraction, by estimation, is 60 to 65%.   Assessment: Encounter Diagnoses  Name Primary?   Abdominal pain, epigastric Yes   Bloating    Non-cardiac chest pain        43 year old female patient with chronic epigastric pain/chest discomfort not improved with dietary changes and PPI therapy. I will order upper GI endoscopy in [facility] with Dr. Myrtie Neither to r/o gastritis or PUD. We discussed trialing another PPI, however patient would like to hold off until after procedure. Normal LFT's. No overt bleeding. Avoid NSAIDs. Consider imaging if workup negative. It is also important to mention lab work from 01/2022 does show an elevated parietal Cell Ab indicating possible gastric lining inflammation. Last B 12 level checked 3/23 was normal at 397.  Plan: -Avoid NSAIDs -Keep diary of symptoms -GERD diet, no late meals -Recommend starting Omeprazole- patient would like to hold off - Schedule EGD with Dr. Myrtie Neither in [facility]. The risks and benefits of EGD with possible biopsies and esophageal dilation were discussed with the  patient who agrees to proceed.   Thank you for the courtesy of this consult. Please call me with any questions or concerns.   Joshuan Bolander, FNP-C [facility] 03/21/2023, 2:04 PM  Cc: Deeann Saint, MD

## 2023-03-21 ENCOUNTER — Encounter: Payer: Self-pay | Admitting: Gastroenterology

## 2023-03-21 ENCOUNTER — Ambulatory Visit: Payer: No Typology Code available for payment source | Admitting: Gastroenterology

## 2023-03-21 VITALS — BP 112/64 | HR 71 | Ht 65.0 in | Wt 212.0 lb

## 2023-03-21 DIAGNOSIS — R0789 Other chest pain: Secondary | ICD-10-CM

## 2023-03-21 DIAGNOSIS — R1013 Epigastric pain: Secondary | ICD-10-CM

## 2023-03-21 DIAGNOSIS — R140 Abdominal distension (gaseous): Secondary | ICD-10-CM | POA: Diagnosis not present

## 2023-03-21 NOTE — Patient Instructions (Signed)
You have been scheduled for an endoscopy. Please follow written instructions given to you at your visit today.  If you use inhalers (even only as needed), please bring them with you on the day of your procedure.  If you take any of the following medications, they will need to be adjusted prior to your procedure:   DO NOT TAKE 7 DAYS PRIOR TO TEST- Trulicity (dulaglutide) Ozempic, Wegovy (semaglutide) Mounjaro (tirzepatide) Bydureon Bcise (exanatide extended release)  DO NOT TAKE 1 DAY PRIOR TO YOUR TEST Rybelsus (semaglutide) Adlyxin (lixisenatide) Victoza (liraglutide) Byetta (exanatide) ___________________________________________________________________________  _______________________________________________________  If your blood pressure at your visit was 140/90 or greater, please contact your primary care physician to follow up on this.  _______________________________________________________  If you are age 27 or older, your body mass index should be between 23-30. Your Body mass index is 35.28 kg/m. If this is out of the aforementioned range listed, please consider follow up with your Primary Care Provider.  If you are age 71 or younger, your body mass index should be between 19-25. Your Body mass index is 35.28 kg/m. If this is out of the aformentioned range listed, please consider follow up with your Primary Care Provider.   ________________________________________________________  The [facility] providers would like to encourage you to use [facility] to communicate with providers for non-urgent requests or questions.  Due to long hold times on the telephone, sending your provider a message by [facility] may be a faster and more efficient way to get a response.  Please allow 48 business hours for a response.  Please remember that this is for non-urgent requests.  _______________________________________________________

## 2023-03-22 ENCOUNTER — Telehealth: Payer: Self-pay

## 2023-03-22 DIAGNOSIS — R1013 Epigastric pain: Secondary | ICD-10-CM

## 2023-03-22 DIAGNOSIS — R140 Abdominal distension (gaseous): Secondary | ICD-10-CM

## 2023-03-22 DIAGNOSIS — R0789 Other chest pain: Secondary | ICD-10-CM

## 2023-03-22 NOTE — Progress Notes (Signed)
____________________________________________________________  Attending physician addendum:  Thank you for sending this case to me. I have reviewed the entire note and agree with the plan.  The chest and throat pain did not sound entirely typical for GERD, especially with lack of improvement on PPI.  Endoscopic evaluation certainly reasonable for anatomy epigastric pain, this is not clearly reflux. If she has not had any abdominal imaging, please contact her and arrange a right upper quadrant ultrasound to evaluate for gallstones.  Amada Jupiter, MD  ____________________________________________________________

## 2023-03-22 NOTE — Telephone Encounter (Signed)
-----   Message from Margarite Gouge May sent at 03/22/2023  9:40 AM EST ----- Regarding: testing Emily Moss, Good morning, please let patient know that Dr. Myrtie Neither reviewed my note and would also like to order abdominal US RUQ to rule out gallstones.  Thank you,  Deanna, NP ----- Message ----- From: Sherrilyn Rist, MD Sent: 03/22/2023   9:31 AM EST To: Margarite Gouge May, NP     ----- Message ----- From: May, Deanna J, NP Sent: 03/21/2023   2:20 PM EST To: Sherrilyn Rist, MD

## 2023-03-22 NOTE — Telephone Encounter (Signed)
Called and spoke with patient regarding recommendation for RUQ [place] to r/o gallstones. Patient knows to expect a call from radiology scheduling to set up her appt. Pt is aware that I will send her a MyChart message with radiology's contact information in case she does not hear from them this week. Pt verbalized understanding and had no concerns at the end of the call.   RUQ [place] order in epic. Secure staff message sent to radiology scheduling to contact patient to set up appt.

## 2023-03-27 ENCOUNTER — Ambulatory Visit (HOSPITAL_COMMUNITY)
Admission: RE | Admit: 2023-03-27 | Discharge: 2023-03-27 | Disposition: A | Discharge status: Home / Self Care | Payer: No Typology Code available for payment source | Source: Ambulatory Visit | Attending: Gastroenterology | Admitting: Gastroenterology

## 2023-03-27 DIAGNOSIS — R1013 Epigastric pain: Secondary | ICD-10-CM | POA: Insufficient documentation

## 2023-03-27 DIAGNOSIS — R0789 Other chest pain: Secondary | ICD-10-CM | POA: Insufficient documentation

## 2023-03-27 DIAGNOSIS — R140 Abdominal distension (gaseous): Secondary | ICD-10-CM | POA: Diagnosis present

## 2023-04-28 ENCOUNTER — Encounter: Payer: Self-pay | Admitting: Gastroenterology

## 2023-04-28 ENCOUNTER — Ambulatory Visit: Payer: No Typology Code available for payment source | Admitting: Gastroenterology

## 2023-04-28 VITALS — BP 109/76 | HR 69 | Temp 98.1°F | Resp 14 | Ht 65.0 in | Wt 212.0 lb

## 2023-04-28 DIAGNOSIS — K317 Polyp of stomach and duodenum: Secondary | ICD-10-CM | POA: Diagnosis present

## 2023-04-28 DIAGNOSIS — R1013 Epigastric pain: Secondary | ICD-10-CM

## 2023-04-28 MED ORDER — SODIUM CHLORIDE 0.9 % IV SOLN: 500.0000 mL | Freq: Once | INTRAVENOUS | Status: DC

## 2023-04-28 NOTE — Progress Notes (Signed)
 History and Physical:  This patient presents for endoscopic testing for: Encounter Diagnosis  Name Primary?   Abdominal pain, epigastric Yes    43 year old woman here today for endoscopic evaluation of epigastric pain as outlined in office consult note dated 03/21/2023. Negative cardiac workup, and subsequent right upper quadrant ultrasound with some altered hepatic echotexture suggesting possible fatty liver but no gallstones or other abnormalities. Normal hepatic function panel 03/02/2023 Patient also reported no improvement in this pain with a trial of PPI.  Patient is otherwise without complaints or active issues today.   Past Medical History: Past Medical History:  Diagnosis Date   Abnormal Pap smear 2009   LAST PAP 05/2011   Asthma    CHILDHOOD   Diabetes mellitus    gestational- diet controlled   Eczema    Fibroid 2010   Gestational diabetes    H/O varicella    Headache(784.0)    one severe headache during pregnancy    Heart murmur    AT BIRTH RESOLVED   Heartburn in pregnancy    Infection    UTI X 1   Infection 2000, 2002,2008   CHLAMYDIA   Recurrent upper respiratory infection (URI)    RECURRENT BRONCHITIS     Past Surgical History: Past Surgical History:  Procedure Laterality Date   CESAREAN SECTION  01/20/2012   Procedure: CESAREAN SECTION;  Surgeon: Hal Morales, MD;  Location: [facility];  Service: Obstetrics;  Laterality: N/A;   CESAREAN SECTION N/A 05/03/2017   Procedure: CESAREAN SECTION;  Surgeon: Gerald Leitz, MD;  Location: [facility];  Service: Obstetrics;  Laterality: N/A;  EDD 05/31/17   NO PAST SURGERIES     WISDOM TOOTH EXTRACTION  2011   x 2    Allergies: No Known Allergies  Outpatient Meds: Current Outpatient Medications  Medication Sig Dispense Refill   amLODipine (NORVASC) 5 MG tablet Take 1 tablet (5 mg total) by mouth daily. 90 tablet 3   Multiple Vitamin (MULTIVITAMIN WITH MINERALS) TABS tablet Take 1 tablet by mouth daily.      spironolactone (ALDACTONE) 25 MG tablet Take 1 tablet (25 mg total) by mouth daily. 90 tablet 3   Current Facility-Administered Medications  Medication Dose Route Frequency Provider Last Rate Last Admin   0.9 %  sodium chloride infusion  500 mL Intravenous Once Sherrilyn Rist, MD          ___________________________________________________________________ Objective   Exam:  BP 132/83   Pulse 67   Temp 98.1 F (36.7 C) (Temporal)   Ht 5\' 5"  (1.651 m)   Wt 212 lb (96.2 kg)   LMP 04/17/2023   SpO2 100%   BMI 35.28 kg/m   CV: regular , S1/S2 Resp: clear to auscultation bilaterally, normal RR and effort noted GI: soft, no tenderness, with active bowel sounds.   Assessment: Encounter Diagnosis  Name Primary?   Abdominal pain, epigastric Yes     Plan:  EGD  The benefits and risks of the planned procedure(s) were described in detail with the patient or (when appropriate) their health care proxy.  Risks were outlined as including, but not limited to, bleeding, infection, perforation, adverse medication reaction leading to cardiac or pulmonary decompensation, pancreatitis (if ERCP).  The limitation of incomplete mucosal visualization was also discussed.  No guarantees or warranties were given.  The patient is appropriate for an endoscopic procedure in the ambulatory setting.   - Amada Jupiter, MD

## 2023-04-28 NOTE — Progress Notes (Signed)
 Pt's states no medical or surgical changes since previsit or office visit.

## 2023-04-28 NOTE — Progress Notes (Signed)
 A/O x 3, gd SR's, VSS, report to RN

## 2023-04-28 NOTE — Progress Notes (Signed)
 Called to room to assist during endoscopic procedure.  Patient ID and intended procedure confirmed with present staff. Received instructions for my participation in the procedure from the performing physician.

## 2023-04-28 NOTE — Patient Instructions (Signed)
 Thank you for letting us care for your healthcare needs today! Please await pathology results.  YOU HAD AN ENDOSCOPIC PROCEDURE TODAY AT THE [facility]:   Refer to the procedure report that was given to you for any specific questions about what was found during the examination.  If the procedure report does not answer your questions, please call your gastroenterologist to clarify.  If you requested that your care partner not be given the details of your procedure findings, then the procedure report has been included in a sealed envelope for you to review at your convenience later.  YOU SHOULD EXPECT: Some feelings of bloating in the abdomen. Passage of more gas than usual.  Walking can help get rid of the air that was put into your GI tract during the procedure and reduce the bloating. If you had a lower endoscopy (such as a colonoscopy or flexible sigmoidoscopy) you may notice spotting of blood in your stool or on the toilet paper. If you underwent a bowel prep for your procedure, you may not have a normal bowel movement for a few days.  Please Note:  You might notice some irritation and congestion in your nose or some drainage.  This is from the oxygen used during your procedure.  There is no need for concern and it should clear up in a day or so.  SYMPTOMS TO REPORT IMMEDIATELY:  Following upper endoscopy (EGD)  Vomiting of blood or coffee ground material  New chest pain or pain under the shoulder blades  Painful or persistently difficult swallowing  New shortness of breath  Fever of 100F or higher  Black, tarry-looking stools  For urgent or emergent issues, a gastroenterologist can be reached at any hour by calling [phone]. Do not use MyChart messaging for urgent concerns.    DIET:  We do recommend a small meal at first, but then you may proceed to your regular diet.  Drink plenty of fluids but you should avoid alcoholic beverages for 24 hours.  ACTIVITY:  You should  plan to take it easy for the rest of today and you should NOT DRIVE or use heavy machinery until tomorrow (because of the sedation medicines used during the test).    FOLLOW UP: Our staff will call the number listed on your records the next business day following your procedure.  We will call around 7:15- 8:00 am to check on you and address any questions or concerns that you may have regarding the information given to you following your procedure. If we do not reach you, we will leave a message.     If any biopsies were taken you will be contacted by phone or by letter within the next 1-3 weeks.  Please call us at [phone] if you have not heard about the biopsies in 3 weeks.    SIGNATURES/CONFIDENTIALITY: You and/or your care partner have signed paperwork which will be entered into your electronic medical record.  These signatures attest to the fact that that the information above on your After Visit Summary has been reviewed and is understood.  Full responsibility of the confidentiality of this discharge information lies with you and/or your care-partner.

## 2023-04-28 NOTE — Op Note (Signed)
 [facility] Patient Name: Emily Moss Procedure Date: 04/28/2023 9:44 AM MRN: [id number] Endoscopist: Sherilyn Cooter L. Myrtie Neither , MD, [id number] Age: 43 Referring MD:  Date of Birth: 1981/02/06 Gender: Female Account #: [id number] Procedure:                Upper GI endoscopy Indications:              Epigastric abdominal pain                           Clinical details and recent office consult note,                            and no gallstones on subsequent RUQ ultrasound                           Patient reports today that the pain is lately                            improved and less frequent. Pain not improved (and                            perhaps worse) on trial of acid suppression.                           Denies dysphagia, nausea, vomiting, loss of                            appetite or weight loss. Medicines:                Monitored Anesthesia Care Procedure:                Pre-Anesthesia Assessment:                           - Prior to the procedure, a History and Physical                            was performed, and patient medications and                            allergies were reviewed. The patient's tolerance of                            previous anesthesia was also reviewed. The risks                            and benefits of the procedure and the sedation                            options and risks were discussed with the patient.                            All questions were answered, and informed consent  was obtained. Prior Anticoagulants: The patient has                            taken no anticoagulant or antiplatelet agents. ASA                            Grade Assessment: II - A patient with mild systemic                            disease. After reviewing the risks and benefits,                            the patient was deemed in satisfactory condition to                            undergo the procedure.                            After obtaining informed consent, the endoscope was                            passed under direct vision. Throughout the                            procedure, the patient's blood pressure, pulse, and                            oxygen saturations were monitored continuously. The                            Olympus Scope [id number] was introduced through the                            mouth, and advanced to the second part of duodenum.                            The upper GI endoscopy was accomplished without                            difficulty. The patient tolerated the procedure                            well. Scope In: Scope Out: Findings:                 The larynx was normal.                           The esophagus was normal.                           Normal mucosa was found in the entire examined                            stomach. Biopsies were taken with a cold forceps  for histology. (Antrum and body in 1 jar to rule                            out H. pylori)                           Multiple sessile fundic gland polyps were found in                            the gastric fundus and in the gastric body.                           The cardia and gastric fundus were normal on                            retroflexion. Stomach distended well with                            insufflation.                           The examined duodenum was normal. Complications:            No immediate complications. Estimated Blood Loss:     Estimated blood loss was minimal. Impression:               - Normal larynx.                           - Normal esophagus.                           - Normal mucosa was found in the entire stomach.                            Biopsied.                           - Multiple fundic gland polyps.                           - Normal examined duodenum.                           If biopsies normal, no visible cause for this pain                             discovered. Recommendation:           - Patient has a contact number available for                            emergencies. The signs and symptoms of potential                            delayed complications were discussed with the  patient. Return to normal activities tomorrow.                            Written discharge instructions were provided to the                            patient.                           - Resume previous diet.                           - Continue present medications.                           - Await pathology results. Emily Moss L. Myrtie Neither, MD 04/28/2023 10:18:54 AM This report has been signed electronically.

## 2023-05-01 ENCOUNTER — Telehealth: Payer: Self-pay | Admitting: *Deleted

## 2023-05-01 NOTE — Telephone Encounter (Signed)
  Follow up Call-     04/28/2023    9:32 AM  Call back number  Post procedure Call Back phone  # [phone]  Permission to leave phone message Yes   Left message to call back if any needs or questions

## 2023-05-02 LAB — SURGICAL PATHOLOGY

## 2023-05-05 ENCOUNTER — Encounter: Payer: Self-pay | Admitting: Gastroenterology

## 2023-07-14 ENCOUNTER — Encounter: Payer: Self-pay | Admitting: Cardiology

## 2023-08-28 ENCOUNTER — Other Ambulatory Visit: Payer: Self-pay | Admitting: Obstetrics and Gynecology

## 2023-08-28 DIAGNOSIS — Z1231 Encounter for screening mammogram for malignant neoplasm of breast: Secondary | ICD-10-CM

## 2023-10-12 ENCOUNTER — Ambulatory Visit
Admission: RE | Admit: 2023-10-12 | Discharge: 2023-10-12 | Disposition: A | Discharge status: Home / Self Care | Source: Ambulatory Visit | Attending: Obstetrics and Gynecology | Admitting: Obstetrics and Gynecology

## 2023-10-12 DIAGNOSIS — Z1231 Encounter for screening mammogram for malignant neoplasm of breast: Secondary | ICD-10-CM

## 2023-10-20 ENCOUNTER — Encounter (HOSPITAL_BASED_OUTPATIENT_CLINIC_OR_DEPARTMENT_OTHER): Payer: Self-pay | Admitting: *Deleted

## 2023-10-24 ENCOUNTER — Ambulatory Visit: Attending: Cardiology | Admitting: Cardiology

## 2023-10-24 ENCOUNTER — Encounter: Payer: Self-pay | Admitting: Cardiology

## 2023-10-24 VITALS — BP 134/84 | HR 63 | Ht 65.0 in | Wt 221.8 lb

## 2023-10-24 DIAGNOSIS — R002 Palpitations: Secondary | ICD-10-CM | POA: Diagnosis not present

## 2023-10-24 DIAGNOSIS — I10 Essential (primary) hypertension: Secondary | ICD-10-CM

## 2023-10-24 DIAGNOSIS — R0609 Other forms of dyspnea: Secondary | ICD-10-CM
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
## 2023-10-24 NOTE — Patient Instructions (Signed)
 Medication Instructions:  Your physician recommends that you continue on your current medications as directed. Please refer to the Current Medication list given to you today.  *If you need a refill on your cardiac medications before your next appointment, please call your pharmacy*  Testing/Procedures: Your physician has requested that you have a Exercise Stress Echocardiogram. Please follow instruction sheet as given.      Stress Echocardiogram Information Sheet                                                      Instructions:    1. You may take your morning medications the morning of the test.  2. Light breakfast no caffeine .  3. Dress prepared to exercise.  4. DO NOT use ANY caffeine  or tobacco products 3 hours before appointment.  5. Please bring all current prescription medications.   Please note: We ask at that you not bring children with you during ultrasound (echo/ vascular) testing. Due to room size and safety concerns, children are not allowed in the ultrasound rooms during exams. Our front office staff cannot provide observation of children in our lobby area while testing is being conducted. An adult accompanying a patient to their appointment will only be allowed in the ultrasound room at the discretion of the ultrasound technician under special circumstances. We apologize for any inconvenience.    Follow-Up: At [facility], you and your health needs are our priority.  As part of our continuing mission to provide you with exceptional heart care, our providers are all part of one team.  This team includes your primary Cardiologist (physician) and Advanced Practice Providers or APPs (Physician Assistants and Nurse Practitioners) who all work together to provide you with the care you need, when you need it.  Your next appointment:   1 year(s)  Provider:   Kardie Tobb, DO

## 2023-10-31 ENCOUNTER — Other Ambulatory Visit: Payer: Self-pay | Admitting: Family Medicine

## 2023-10-31 DIAGNOSIS — I10 Essential (primary) hypertension: Secondary | ICD-10-CM

## 2023-11-02 ENCOUNTER — Encounter (HOSPITAL_COMMUNITY): Payer: Self-pay | Admitting: *Deleted

## 2023-11-09 ENCOUNTER — Ambulatory Visit (HOSPITAL_COMMUNITY)
Admission: RE | Admit: 2023-11-09 | Discharge: 2023-11-09 | Disposition: A | Discharge status: Home / Self Care | Source: Ambulatory Visit | Attending: Cardiology | Admitting: Cardiology

## 2023-11-09 ENCOUNTER — Ambulatory Visit (HOSPITAL_COMMUNITY)
Admission: RE | Admit: 2023-11-09 | Discharge: 2023-11-09 | Disposition: A | Source: Ambulatory Visit | Attending: Cardiology

## 2023-11-09 ENCOUNTER — Encounter (HOSPITAL_COMMUNITY)

## 2023-11-09 DIAGNOSIS — R0609 Other forms of dyspnea: Secondary | ICD-10-CM

## 2023-11-09 LAB — ECHOCARDIOGRAM STRESS TEST
Area-P 1/2: 3.91 cm2
S' Lateral: 2.5 cm

## 2023-11-09 MED ORDER — PERFLUTREN LIPID MICROSPHERE
1.0000 mL | INTRAVENOUS | Status: AC | PRN
  Administered 2023-11-09 (×3): 2 mL via INTRAVENOUS

## 2023-11-10 ENCOUNTER — Ambulatory Visit: Payer: Self-pay | Admitting: Cardiology

## 2024-03-01 ENCOUNTER — Ambulatory Visit: Admitting: Family Medicine

## 2024-03-01 ENCOUNTER — Encounter: Payer: Self-pay | Admitting: Family Medicine

## 2024-03-01 VITALS — BP 132/82 | HR 90 | Temp 98.3°F | Ht 65.0 in | Wt 225.0 lb

## 2024-03-01 DIAGNOSIS — I10 Essential (primary) hypertension: Secondary | ICD-10-CM

## 2024-03-01 DIAGNOSIS — K219 Gastro-esophageal reflux disease without esophagitis: Secondary | ICD-10-CM | POA: Diagnosis not present

## 2024-03-01 DIAGNOSIS — Z0000 Encounter for general adult medical examination without abnormal findings: Secondary | ICD-10-CM | POA: Diagnosis not present

## 2024-03-01 MED ORDER — AMLODIPINE BESYLATE 5 MG PO TABS: 5.0000 mg | ORAL_TABLET | Freq: Every day | ORAL | 3 refills | Status: AC

## 2024-03-01 MED ORDER — PANTOPRAZOLE SODIUM 40 MG PO TBEC: 40.0000 mg | DELAYED_RELEASE_TABLET | Freq: Every day | ORAL | 3 refills | Status: AC

## 2024-03-01 NOTE — Progress Notes (Signed)
 "  Established Patient Office Visit   Subjective  Patient ID: Emily Moss, female    DOB: 28-Nov-1980  Age: 44 y.o. MRN: [id number]  Chief Complaint  Patient presents with   Annual Exam    Pt is  a43 you female seen for CPE.  Pt is not fasting.  Notes discomfort/pressure sensation in central chest.  At times has acid taste/regurg in mouth.  Can also cause cough.  Noted after eating tomato-based pasta sauce.  Patient previously given PPI but does not recall taking consistently.  Past EGD negative.  Patient had follow-up with cardiology which was normal.  Still having random sharp discomfort in back and evenings.  Restarted exercising and Zumba classes.  Notes occasional left knee pain.  BP stable.    Patient Active Problem List   Diagnosis Date Noted   Excessive daytime sleepiness 11/30/2021   S/P cesarean section 05/03/2017   Impaired glucose tolerance test 04/05/2017   Cesarean delivery delivered 01/23/2012   Status post primary low transverse cesarean section, with myomectomy 01/22/2012   Obesity 01/20/2012   Gestational diabetes mellitus in pregnancy 11/06/2011   Rh negative state in antepartum period 10/25/2011   Headache 08/15/2011   Past Medical History:  Diagnosis Date   Abnormal Pap smear 2009   LAST PAP 05/2011   Asthma    CHILDHOOD   Diabetes mellitus    gestational- diet controlled   Eczema    Fibroid 2010   Gestational diabetes    H/O varicella    Headache(784.0)    one severe headache during pregnancy    Heart murmur    AT BIRTH RESOLVED   Heartburn in pregnancy    Hypertension 09/07/2020   Infection    UTI X 1   Infection 2000, 2002,2008   CHLAMYDIA   Recurrent upper respiratory infection (URI)    RECURRENT BRONCHITIS   Past Surgical History:  Procedure Laterality Date   CESAREAN SECTION  01/20/2012   Procedure: CESAREAN SECTION;  Surgeon: Shanda SHAUNNA Muscat, MD;  Location: [facility];  Service: Obstetrics;  Laterality: N/A;   CESAREAN SECTION N/A  05/03/2017   Procedure: CESAREAN SECTION;  Surgeon: Rosalva Sawyer, MD;  Location: [facility] BIRTHING SUIT[facility];  Service: Obstetrics;  Laterality: N/A;  EDD 05/31/17   NO PAST SURGERIES     WISDOM TOOTH EXTRACTION  2011   x 2   Social History[1] Family History  Problem Relation Age of Onset   Breast cancer Mother    Diabetes Mother    Hypertension Mother    Cancer Mother 44       BREAST   Drug abuse Sister    Hypertension Sister    Alcohol abuse Maternal Uncle    Alcohol abuse Maternal Grandfather    Alzheimer's disease Maternal Grandfather    Lupus Paternal Grandmother    Cancer Cousin        LUNG/BREAST/LUNG   Stroke Cousin    Drug abuse Cousin    Cancer Cousin        PANCREATIC;COLON   Drug abuse Brother    Hypertension Brother    Asthma Other    Allergies[2]  ROS Negative unless stated above    Objective:     BP 132/82 (BP Location: Left Arm, Patient Position: Sitting, Cuff Size: Large)   Pulse 90   Temp 98.3 F (36.8 C) (Oral)   Ht 5' 5 (1.651 m)   Wt 225 lb (102.1 kg)   LMP 02/28/2024 (Exact Date)   SpO2 97%  BMI 37.44 kg/m  BP Readings from Last 3 Encounters:  03/01/24 132/82  10/24/23 134/84  04/28/23 109/76   Wt Readings from Last 3 Encounters:  03/01/24 225 lb (102.1 kg)  10/24/23 221 lb 12.8 oz (100.6 kg)  04/28/23 212 lb (96.2 kg)      Physical Exam Constitutional:      Appearance: Normal appearance.  HENT:     Head: Normocephalic and atraumatic.     Right Ear: Tympanic membrane, ear canal and external ear normal.     Left Ear: Tympanic membrane, ear canal and external ear normal.     Nose: Nose normal.     Mouth/Throat:     Mouth: Mucous membranes are moist.     Pharynx: No oropharyngeal exudate or posterior oropharyngeal erythema.  Eyes:     General: No scleral icterus.    Extraocular Movements: Extraocular movements intact.     Conjunctiva/sclera: Conjunctivae normal.     Pupils: Pupils are equal, round, and reactive to light.  Neck:      Thyroid : No thyromegaly.     Vascular: No carotid bruit.  Cardiovascular:     Rate and Rhythm: Normal rate and regular rhythm.     Pulses: Normal pulses.     Heart sounds: Normal heart sounds. No murmur heard.    No friction rub.  Pulmonary:     Effort: Pulmonary effort is normal.     Breath sounds: Normal breath sounds. No wheezing, rhonchi or rales.  Abdominal:     General: Bowel sounds are normal.     Palpations: Abdomen is soft.     Tenderness: There is no abdominal tenderness.  Musculoskeletal:        General: No deformity. Normal range of motion.  Lymphadenopathy:     Cervical: No cervical adenopathy.  Skin:    General: Skin is warm and dry.     Findings: No lesion.  Neurological:     General: No focal deficit present.     Mental Status: She is alert and oriented to person, place, and time.  Psychiatric:        Mood and Affect: Mood normal.        Thought Content: Thought content normal.        03/01/2024    1:47 PM 03/02/2023   10:41 AM 02/11/2022    1:28 PM  Depression screen PHQ 2/9  Decreased Interest 0 0 0  Down, Depressed, Hopeless 0 0 0  PHQ - 2 Score 0 0 0  Altered sleeping 0 0 0  Tired, decreased energy 0 0 0  Change in appetite 0 0 0  Feeling bad or failure about yourself  0 0 0  Trouble concentrating 0 0 0  Moving slowly or fidgety/restless 0 0 0  Suicidal thoughts 0 0 0  PHQ-9 Score 0 0  0   Difficult doing work/chores Not difficult at all Not difficult at all      Data saved with a previous flowsheet row definition      03/01/2024    1:47 PM 03/02/2023   10:41 AM  GAD 7 : Generalized Anxiety Score  Nervous, Anxious, on Edge 0 0   Control/stop worrying 0 0   Worry too much - different things 0 0   Trouble relaxing 0 0   Restless 0 0   Easily annoyed or irritable 1 0   Afraid - awful might happen 0 0   Total GAD 7 Score 1 0  Anxiety Difficulty Not  difficult at all Not difficult at all     Data saved with a previous flowsheet row definition      No results found for any visits on 03/01/24.    Assessment & Plan:   Well adult exam -     CMP12+LP+TP+TSH+5AC+CBC/D/P...; Future  Essential hypertension -     amLODIPine  Besylate; Take 1 tablet (5 mg total) by mouth daily.  Dispense: 90 tablet; Refill: 3  Gastroesophageal reflux disease, unspecified whether esophagitis present -     Pantoprazole  Sodium; Take 1 tablet (40 mg total) by mouth daily.  Dispense: 30 tablet; Refill: 3  Age-appropriate health screenings discussed.  Obtain labs.  Immunizations reviewed.  Recent influenza vaccine caused bruising and edema of RUE.  Mammogram up-to-date done 10/09/2023.  Colonoscopy not yet indicated.  Pap done 07/02/2021 with OB/GYN.  Continue current medications for BP is controlled.  Continue lifestyle modifications.  Restart trial of Protonix  for GERD.  Next CPE in 1 year.  Return for continued or worsening symptoms in the next few months.  Return if symptoms worsen or fail to improve, for physical.   Clotilda JONELLE Single, MD     [1]  Social History Tobacco Use   Smoking status: Never   Smokeless tobacco: Never  Vaping Use   Vaping status: Never Used  Substance Use Topics   Alcohol use: No   Drug use: No  [2] No Known Allergies  "

## 2024-03-02 LAB — CMP12+LP+TP+TSH+5AC+CBC/D/P...
ALT: 9 [IU]/L (ref 0–32)
AST: 20 [IU]/L (ref 0–40)
Albumin: 4.3 g/dL (ref 3.9–4.9)
Alkaline Phosphatase: 55 [IU]/L (ref 41–116)
BUN/Creatinine Ratio: 18 (ref 9–23)
BUN: 15 mg/dL (ref 6–24)
Basophils Absolute: 0 10*3/uL (ref 0.0–0.2)
Basos: 1 %
Bilirubin Total: 0.3 mg/dL (ref 0.0–1.2)
Calcium: 9.5 mg/dL (ref 8.7–10.2)
Chloride: 105 mmol/L (ref 96–106)
Chol/HDL Ratio: 2.8 ratio (ref 0.0–4.4)
Cholesterol, Total: 156 mg/dL (ref 100–199)
Creatinine, Ser: 0.83 mg/dL (ref 0.57–1.00)
EOS (ABSOLUTE): 0.1 10*3/uL (ref 0.0–0.4)
Eos: 2 %
Free Thyroxine Index: 2 (ref 1.2–4.9)
GGT: 9 [IU]/L (ref 0–60)
Globulin, Total: 3.1 g/dL (ref 1.5–4.5)
Glucose: 82 mg/dL (ref 70–99)
HDL: 55 mg/dL
Hematocrit: 40 % (ref 34.0–46.6)
Hemoglobin: 12.5 g/dL (ref 11.1–15.9)
Immature Grans (Abs): 0 10*3/uL (ref 0.0–0.1)
Immature Granulocytes: 0 %
LDH: 172 [IU]/L (ref 119–226)
LDL Chol Calc (NIH): 86 mg/dL (ref 0–99)
LDL/HDL Ratio: 1.6 ratio (ref 0.0–3.2)
Lymphocytes Absolute: 2.4 10*3/uL (ref 0.7–3.1)
Lymphs: 49 %
MCH: 27.1 pg (ref 26.6–33.0)
MCHC: 31.3 g/dL — ABNORMAL LOW (ref 31.5–35.7)
MCV: 87 fL (ref 79–97)
Monocytes Absolute: 0.4 10*3/uL (ref 0.1–0.9)
Monocytes: 7 %
Neutrophils Absolute: 2 10*3/uL (ref 1.4–7.0)
Neutrophils: 41 %
Phosphorus: 3.1 mg/dL (ref 3.0–4.3)
Platelets: 299 10*3/uL (ref 150–450)
Potassium: 4.1 mmol/L (ref 3.5–5.2)
RBC: 4.61 x10E6/uL (ref 3.77–5.28)
RDW: 13.4 % (ref 11.7–15.4)
Sodium: 141 mmol/L (ref 134–144)
T3 Uptake Ratio: 27 % (ref 24–39)
T4, Total: 7.5 ug/dL (ref 4.5–12.0)
TSH: 1.36 u[IU]/mL (ref 0.450–4.500)
Total Protein: 7.4 g/dL (ref 6.0–8.5)
Triglycerides: 81 mg/dL (ref 0–149)
Uric Acid: 3.8 mg/dL (ref 2.6–6.2)
VLDL Cholesterol Cal: 15 mg/dL (ref 5–40)
WBC: 4.9 10*3/uL (ref 3.4–10.8)
eGFR: 90 mL/min/{1.73_m2}
